# Patient Record
Sex: Female | Born: 1940
Health system: Southern US, Community
[De-identification: ages and names within clinical notes are randomized; demographics above are authoritative.]

## PROBLEM LIST (undated history)

## (undated) DIAGNOSIS — Z872 Personal history of diseases of the skin and subcutaneous tissue: Secondary | ICD-10-CM

## (undated) DIAGNOSIS — M1990 Unspecified osteoarthritis, unspecified site: Secondary | ICD-10-CM

## (undated) DIAGNOSIS — K219 Gastro-esophageal reflux disease without esophagitis: Secondary | ICD-10-CM

## (undated) DIAGNOSIS — E7800 Pure hypercholesterolemia, unspecified: Secondary | ICD-10-CM

## (undated) DIAGNOSIS — I872 Venous insufficiency (chronic) (peripheral): Secondary | ICD-10-CM

## (undated) DIAGNOSIS — K5730 Diverticulosis of large intestine without perforation or abscess without bleeding: Secondary | ICD-10-CM

## (undated) DIAGNOSIS — N200 Calculus of kidney: Secondary | ICD-10-CM

## (undated) DIAGNOSIS — F419 Anxiety disorder, unspecified: Secondary | ICD-10-CM

## (undated) DIAGNOSIS — I10 Essential (primary) hypertension: Secondary | ICD-10-CM

## (undated) DIAGNOSIS — G5600 Carpal tunnel syndrome, unspecified upper limb: Secondary | ICD-10-CM

## (undated) DIAGNOSIS — IMO0002 Reserved for concepts with insufficient information to code with codable children: Secondary | ICD-10-CM

## (undated) DIAGNOSIS — H02829 Cysts of unspecified eye, unspecified eyelid: Secondary | ICD-10-CM

## (undated) HISTORY — DX: Essential (primary) hypertension: I10

## (undated) HISTORY — DX: Venous insufficiency (chronic) (peripheral): I87.2

## (undated) HISTORY — DX: Gastro-esophageal reflux disease without esophagitis: K21.9

## (undated) HISTORY — DX: Unspecified osteoarthritis, unspecified site: M19.90

## (undated) HISTORY — DX: Calculus of kidney: N20.0

## (undated) HISTORY — DX: Personal history of diseases of the skin and subcutaneous tissue: Z87.2

## (undated) HISTORY — DX: Pure hypercholesterolemia, unspecified: E78.00

## (undated) HISTORY — DX: Diverticulosis of large intestine without perforation or abscess without bleeding: K57.30

## (undated) HISTORY — DX: Carpal tunnel syndrome, unspecified upper limb: G56.00

## (undated) HISTORY — DX: Reserved for concepts with insufficient information to code with codable children: IMO0002

## (undated) HISTORY — DX: Anxiety disorder, unspecified: F41.9

---

## 1999-06-06 ENCOUNTER — Other Ambulatory Visit: Admission: RE | Admit: 1999-06-06 | Discharge: 1999-06-06 | Payer: Self-pay | Admitting: Obstetrics and Gynecology

## 2000-06-18 ENCOUNTER — Other Ambulatory Visit: Admission: RE | Admit: 2000-06-18 | Discharge: 2000-06-18 | Payer: Self-pay | Admitting: Obstetrics and Gynecology

## 2001-06-24 ENCOUNTER — Other Ambulatory Visit: Admission: RE | Admit: 2001-06-24 | Discharge: 2001-06-24 | Payer: Self-pay | Admitting: Obstetrics and Gynecology

## 2002-02-02 HISTORY — PX: LAMINECTOMY AND MICRODISCECTOMY LUMBAR SPINE: SHX1913

## 2002-03-05 ENCOUNTER — Observation Stay (HOSPITAL_COMMUNITY): Admission: RE | Admit: 2002-03-05 | Discharge: 2002-03-06 | Payer: Self-pay | Admitting: Orthopedic Surgery

## 2002-03-05 ENCOUNTER — Encounter: Payer: Self-pay | Admitting: Orthopedic Surgery

## 2002-03-05 ENCOUNTER — Encounter (INDEPENDENT_AMBULATORY_CARE_PROVIDER_SITE_OTHER): Payer: Self-pay | Admitting: Specialist

## 2004-07-13 ENCOUNTER — Ambulatory Visit: Payer: Self-pay | Admitting: Pulmonary Disease

## 2004-08-05 ENCOUNTER — Ambulatory Visit: Payer: Self-pay | Admitting: Pulmonary Disease

## 2004-09-02 ENCOUNTER — Ambulatory Visit: Payer: Self-pay | Admitting: Pulmonary Disease

## 2005-04-04 HISTORY — PX: OTHER SURGICAL HISTORY: SHX169

## 2005-04-20 ENCOUNTER — Ambulatory Visit (HOSPITAL_COMMUNITY): Admission: RE | Admit: 2005-04-20 | Discharge: 2005-04-21 | Payer: Self-pay | Admitting: Orthopedic Surgery

## 2005-07-03 ENCOUNTER — Ambulatory Visit: Payer: Self-pay | Admitting: Pulmonary Disease

## 2005-08-15 ENCOUNTER — Ambulatory Visit: Payer: Self-pay | Admitting: Pulmonary Disease

## 2005-08-24 ENCOUNTER — Ambulatory Visit: Payer: Self-pay | Admitting: Pulmonary Disease

## 2005-12-14 ENCOUNTER — Ambulatory Visit: Payer: Self-pay | Admitting: Pulmonary Disease

## 2005-12-28 ENCOUNTER — Ambulatory Visit: Payer: Self-pay | Admitting: Pulmonary Disease

## 2006-01-25 ENCOUNTER — Ambulatory Visit: Payer: Self-pay | Admitting: Pulmonary Disease

## 2006-02-21 ENCOUNTER — Ambulatory Visit: Payer: Self-pay | Admitting: Pulmonary Disease

## 2006-04-24 ENCOUNTER — Ambulatory Visit: Payer: Self-pay | Admitting: Pulmonary Disease

## 2006-08-23 ENCOUNTER — Ambulatory Visit: Payer: Self-pay | Admitting: Pulmonary Disease

## 2006-08-31 ENCOUNTER — Ambulatory Visit: Payer: Self-pay | Admitting: Internal Medicine

## 2006-10-01 ENCOUNTER — Ambulatory Visit: Payer: Self-pay | Admitting: Pulmonary Disease

## 2006-10-01 LAB — CONVERTED CEMR LAB
OCCULT 1: POSITIVE — AB
OCCULT 2: NEGATIVE
OCCULT 4: NEGATIVE
OCCULT 5: NEGATIVE

## 2006-10-10 ENCOUNTER — Ambulatory Visit: Payer: Self-pay | Admitting: Pulmonary Disease

## 2006-10-10 LAB — CONVERTED CEMR LAB
ALT: 18 units/L (ref 0–40)
Basophils Relative: 0.8 % (ref 0.0–1.0)
Bilirubin, Direct: 0.1 mg/dL (ref 0.0–0.3)
CO2: 27 meq/L (ref 19–32)
Calcium: 9.3 mg/dL (ref 8.4–10.5)
Eosinophils Relative: 2.1 % (ref 0.0–5.0)
GFR calc Af Amer: 81 mL/min
Glucose, Bld: 103 mg/dL — ABNORMAL HIGH (ref 70–99)
Hemoglobin: 13.8 g/dL (ref 12.0–15.0)
Lymphocytes Relative: 27.1 % (ref 12.0–46.0)
Monocytes Absolute: 0.6 10*3/uL (ref 0.2–0.7)
Neutro Abs: 3.9 10*3/uL (ref 1.4–7.7)
Potassium: 3.7 meq/L (ref 3.5–5.1)
Total Bilirubin: 0.5 mg/dL (ref 0.3–1.2)
Total Protein: 6.4 g/dL (ref 6.0–8.3)
VLDL: 35 mg/dL (ref 0–40)
WBC: 6.5 10*3/uL (ref 4.5–10.5)

## 2007-05-07 ENCOUNTER — Ambulatory Visit: Payer: Self-pay | Admitting: Pulmonary Disease

## 2007-08-28 ENCOUNTER — Ambulatory Visit: Payer: Self-pay | Admitting: Pulmonary Disease

## 2007-08-28 DIAGNOSIS — I10 Essential (primary) hypertension: Secondary | ICD-10-CM

## 2007-08-28 DIAGNOSIS — G5600 Carpal tunnel syndrome, unspecified upper limb: Secondary | ICD-10-CM | POA: Insufficient documentation

## 2007-08-28 DIAGNOSIS — K219 Gastro-esophageal reflux disease without esophagitis: Secondary | ICD-10-CM | POA: Insufficient documentation

## 2007-08-28 DIAGNOSIS — E7800 Pure hypercholesterolemia, unspecified: Secondary | ICD-10-CM

## 2007-08-28 DIAGNOSIS — I872 Venous insufficiency (chronic) (peripheral): Secondary | ICD-10-CM | POA: Insufficient documentation

## 2007-08-28 DIAGNOSIS — IMO0002 Reserved for concepts with insufficient information to code with codable children: Secondary | ICD-10-CM | POA: Insufficient documentation

## 2007-08-28 DIAGNOSIS — N6009 Solitary cyst of unspecified breast: Secondary | ICD-10-CM

## 2007-08-28 DIAGNOSIS — F411 Generalized anxiety disorder: Secondary | ICD-10-CM | POA: Insufficient documentation

## 2007-09-05 HISTORY — PX: CARPAL TUNNEL RELEASE: SHX101

## 2007-10-24 LAB — CONVERTED CEMR LAB
ALT: 22 units/L (ref 0–35)
Alkaline Phosphatase: 60 units/L (ref 39–117)
BUN: 15 mg/dL (ref 6–23)
Basophils Absolute: 0 10*3/uL (ref 0.0–0.1)
Bilirubin, Direct: 0.1 mg/dL (ref 0.0–0.3)
Calcium: 9.3 mg/dL (ref 8.4–10.5)
Eosinophils Absolute: 0.2 10*3/uL (ref 0.0–0.6)
Eosinophils Relative: 1.8 % (ref 0.0–5.0)
GFR calc Af Amer: 81 mL/min
GFR calc non Af Amer: 67 mL/min
Ketones, ur: NEGATIVE mg/dL
Leukocytes, UA: NEGATIVE
MCV: 92.8 fL (ref 78.0–100.0)
Monocytes Relative: 7.9 % (ref 3.0–11.0)
Mucus, UA: NEGATIVE
Platelets: 321 10*3/uL (ref 150–400)
Potassium: 4 meq/L (ref 3.5–5.1)
RBC: 4.49 M/uL (ref 3.87–5.11)
Total Protein: 6.9 g/dL (ref 6.0–8.3)
Urine Glucose: NEGATIVE mg/dL
Urobilinogen, UA: 0.2 (ref 0.0–1.0)
WBC: 9.3 10*3/uL (ref 4.5–10.5)
pH: 6.5 (ref 5.0–8.0)

## 2008-03-23 ENCOUNTER — Ambulatory Visit (HOSPITAL_BASED_OUTPATIENT_CLINIC_OR_DEPARTMENT_OTHER): Admission: RE | Admit: 2008-03-23 | Discharge: 2008-03-23 | Payer: Self-pay | Admitting: Orthopedic Surgery

## 2008-07-31 ENCOUNTER — Telehealth (INDEPENDENT_AMBULATORY_CARE_PROVIDER_SITE_OTHER): Payer: Self-pay | Admitting: *Deleted

## 2008-08-31 ENCOUNTER — Encounter: Admission: RE | Admit: 2008-08-31 | Discharge: 2008-08-31 | Payer: Self-pay | Admitting: Obstetrics and Gynecology

## 2008-09-22 ENCOUNTER — Ambulatory Visit: Payer: Self-pay | Admitting: Family Medicine

## 2008-09-22 ENCOUNTER — Ambulatory Visit: Payer: Self-pay | Admitting: Pulmonary Disease

## 2008-10-11 LAB — CONVERTED CEMR LAB
ALT: 19 units/L (ref 0–35)
Basophils Absolute: 0.5 10*3/uL — ABNORMAL HIGH (ref 0.0–0.1)
Bilirubin Urine: NEGATIVE
CO2: 27 meq/L (ref 19–32)
Calcium: 9.7 mg/dL (ref 8.4–10.5)
Cholesterol: 188 mg/dL (ref 0–200)
Creatinine, Ser: 1 mg/dL (ref 0.4–1.2)
Crystals: NEGATIVE
GFR calc Af Amer: 71 mL/min
GFR calc non Af Amer: 59 mL/min
HCT: 41.4 % (ref 36.0–46.0)
HDL: 59 mg/dL (ref >39.0)
Hemoglobin, Urine: NEGATIVE
Hemoglobin: 14.6 g/dL (ref 12.0–15.0)
LDL Cholesterol: 107 mg/dL — ABNORMAL HIGH (ref 0–99)
MCHC: 35.3 g/dL (ref 30.0–36.0)
Monocytes Absolute: 0.4 10*3/uL (ref 0.1–1.0)
Monocytes Relative: 4.2 % (ref 3.0–12.0)
Neutro Abs: 6.3 10*3/uL (ref 1.4–7.7)
Nitrite: NEGATIVE
Platelets: 279 10*3/uL (ref 150–400)
RDW: 12.6 % (ref 11.5–14.6)
Sodium: 143 meq/L (ref 135–145)
TSH: 1.13 microintl units/mL (ref 0.35–5.50)
Total Bilirubin: 0.8 mg/dL (ref 0.3–1.2)
Total CHOL/HDL Ratio: 3.2
Triglycerides: 108 mg/dL (ref 0–149)
Urobilinogen, UA: 0.2 (ref 0.0–1.0)
Vit D, 1,25-Dihydroxy: 39 (ref 30–89)

## 2008-12-02 ENCOUNTER — Telehealth (INDEPENDENT_AMBULATORY_CARE_PROVIDER_SITE_OTHER): Payer: Self-pay | Admitting: *Deleted

## 2009-04-29 ENCOUNTER — Encounter: Admission: RE | Admit: 2009-04-29 | Discharge: 2009-04-29 | Payer: Self-pay | Admitting: Obstetrics and Gynecology

## 2009-07-09 ENCOUNTER — Encounter (INDEPENDENT_AMBULATORY_CARE_PROVIDER_SITE_OTHER): Payer: Self-pay | Admitting: *Deleted

## 2009-10-05 ENCOUNTER — Ambulatory Visit: Payer: Self-pay | Admitting: Pulmonary Disease

## 2009-10-10 LAB — CONVERTED CEMR LAB
AST: 24 units/L (ref 0–37)
Albumin: 3.8 g/dL (ref 3.5–5.2)
Alkaline Phosphatase: 53 units/L (ref 39–117)
BUN: 8 mg/dL (ref 6–23)
Basophils Absolute: 0.1 10*3/uL (ref 0.0–0.1)
CO2: 29 meq/L (ref 19–32)
Calcium: 9.6 mg/dL (ref 8.4–10.5)
Cholesterol: 168 mg/dL (ref 0–200)
Glucose, Bld: 114 mg/dL — ABNORMAL HIGH (ref 70–99)
HDL: 52.9 mg/dL (ref >39.00)
Lymphocytes Relative: 21.3 % (ref 12.0–46.0)
Monocytes Relative: 6.8 % (ref 3.0–12.0)
Platelets: 272 10*3/uL (ref 150.0–400.0)
Potassium: 4 meq/L (ref 3.5–5.1)
RDW: 12.6 % (ref 11.5–14.6)
Sodium: 141 meq/L (ref 135–145)
TSH: 0.67 microintl units/mL (ref 0.35–5.50)
Total Protein: 7 g/dL (ref 6.0–8.3)
WBC: 8.8 10*3/uL (ref 4.5–10.5)

## 2009-10-15 ENCOUNTER — Encounter (INDEPENDENT_AMBULATORY_CARE_PROVIDER_SITE_OTHER): Payer: Self-pay | Admitting: *Deleted

## 2009-10-28 ENCOUNTER — Encounter (INDEPENDENT_AMBULATORY_CARE_PROVIDER_SITE_OTHER): Payer: Self-pay | Admitting: *Deleted

## 2009-10-28 ENCOUNTER — Ambulatory Visit: Payer: Self-pay | Admitting: Gastroenterology

## 2009-11-08 ENCOUNTER — Encounter: Admission: RE | Admit: 2009-11-08 | Discharge: 2009-11-08 | Payer: Self-pay | Admitting: Obstetrics and Gynecology

## 2009-11-11 ENCOUNTER — Ambulatory Visit: Payer: Self-pay | Admitting: Gastroenterology

## 2010-04-19 ENCOUNTER — Telehealth (INDEPENDENT_AMBULATORY_CARE_PROVIDER_SITE_OTHER): Payer: Self-pay | Admitting: *Deleted

## 2010-06-07 ENCOUNTER — Ambulatory Visit: Payer: Self-pay | Admitting: Pulmonary Disease

## 2010-10-05 ENCOUNTER — Encounter: Payer: Self-pay | Admitting: Pulmonary Disease

## 2010-10-05 ENCOUNTER — Other Ambulatory Visit: Payer: Self-pay | Admitting: Pulmonary Disease

## 2010-10-05 ENCOUNTER — Ambulatory Visit: Admit: 2010-10-05 | Payer: Self-pay | Admitting: Pulmonary Disease

## 2010-10-05 ENCOUNTER — Ambulatory Visit (INDEPENDENT_AMBULATORY_CARE_PROVIDER_SITE_OTHER): Payer: PRIVATE HEALTH INSURANCE | Admitting: Pulmonary Disease

## 2010-10-05 DIAGNOSIS — I10 Essential (primary) hypertension: Secondary | ICD-10-CM

## 2010-10-05 DIAGNOSIS — E7800 Pure hypercholesterolemia, unspecified: Secondary | ICD-10-CM

## 2010-10-05 DIAGNOSIS — IMO0002 Reserved for concepts with insufficient information to code with codable children: Secondary | ICD-10-CM

## 2010-10-05 DIAGNOSIS — I872 Venous insufficiency (chronic) (peripheral): Secondary | ICD-10-CM

## 2010-10-05 DIAGNOSIS — M810 Age-related osteoporosis without current pathological fracture: Secondary | ICD-10-CM

## 2010-10-05 DIAGNOSIS — M1990 Unspecified osteoarthritis, unspecified site: Secondary | ICD-10-CM

## 2010-10-05 DIAGNOSIS — N6009 Solitary cyst of unspecified breast: Secondary | ICD-10-CM

## 2010-10-05 DIAGNOSIS — K219 Gastro-esophageal reflux disease without esophagitis: Secondary | ICD-10-CM

## 2010-10-05 DIAGNOSIS — K5730 Diverticulosis of large intestine without perforation or abscess without bleeding: Secondary | ICD-10-CM | POA: Insufficient documentation

## 2010-10-05 DIAGNOSIS — G5600 Carpal tunnel syndrome, unspecified upper limb: Secondary | ICD-10-CM

## 2010-10-05 LAB — BASIC METABOLIC PANEL
BUN: 14 mg/dL (ref 6–23)
CO2: 29 mEq/L (ref 19–32)
Chloride: 106 mEq/L (ref 96–112)
Creatinine, Ser: 1 mg/dL (ref 0.4–1.2)
Glucose, Bld: 96 mg/dL (ref 70–99)

## 2010-10-05 LAB — CBC WITH DIFFERENTIAL/PLATELET
Basophils Relative: 0.5 % (ref 0.0–3.0)
Eosinophils Absolute: 0.1 10*3/uL (ref 0.0–0.7)
Eosinophils Relative: 1.2 % (ref 0.0–5.0)
Lymphocytes Relative: 24.4 % (ref 12.0–46.0)
MCHC: 34.5 g/dL (ref 30.0–36.0)
Neutrophils Relative %: 66.4 % (ref 43.0–77.0)
RBC: 4.54 Mil/uL (ref 3.87–5.11)
WBC: 8.7 10*3/uL (ref 4.5–10.5)

## 2010-10-05 LAB — HEPATIC FUNCTION PANEL
ALT: 20 U/L (ref 0–35)
Total Protein: 6.9 g/dL (ref 6.0–8.3)

## 2010-10-05 LAB — LIPID PANEL
HDL: 46.2 mg/dL (ref >39.00)
Total CHOL/HDL Ratio: 4
Triglycerides: 120 mg/dL (ref 0.0–149.0)

## 2010-10-06 NOTE — Assessment & Plan Note (Signed)
Summary: cpx- yr ///kp   CC:  Yearly ROV & review of mult medical problems....  History of Present Illness: 70 y/o WF here for a follow up visit... she has HBP, Hypercholesterolemia, & Osteopenia- on meds below...    ~  Jan10:  she has had a good year, without any new complaints or concerns at present... she is due for a follow up BMD today & requests refills for 90 d supplies...   ~  October 05, 2009:  sh's had another good yr- no new complaints or concerns... she had Flu shot 10/10, BP controlled on meds, Chol reasonable on Simva20, continues on Fosamax, calcium, etc...    Current Problems:   Hx of SINUSITIS (ICD-473.9)                                                   ** no recent problems ** Hx of BRONCHITIS (ICD-490)  HYPERTENSION (ICD-401.9) - on ASA 81mg /d,  LISINOPRIL 20mg /d, & ZIAC 5mg /d... BP today= 132/78, takes med regularly, tolerates well... denies HA, fatigue, visual changes, CP, palipit, dizziness, syncope, dyspnea, edema, etc...   VENOUS INSUFFICIENCY (ICD-459.81)  HYPERCHOLESTEROLEMIA (ICD-272.0) - on SIMVASTATIN 20mg /d...  ~  FLP 2/08 on Lip10 showed TChol 188, TG 176, HDL 63, LDL 90  ~  FLP 1/10 on Simva20 showed TChol 188, TG 108, HDL 59, LDL 107  ~  FLP 2/11 on Simva20 showed TChol 168, TG 181, HDL 53, LDL 79  GERD (ICD-530.81) - she uses OTC Prilosec as needed... denies nausea, vomiting, heartburn, diarrhea, constipation, blood in stool, abdominal pain, swelling, gas...  Hx of BREAST CYST (ICD-610.0) - benign breast cyst asp 1989.  Hx of DEGENERATIVE DISC DISEASE (ICD-722.6) - s/p lumbar laminectomy, no signif LBP, etc...  CARPAL TUNNEL SYNDROME, BILATERAL (ICD-354.0) - she has seen DrAplington in the past; uses wrist splints qHS- had right carpel tunnel release, & right rotator cuff surg...  OSTEOPOROSIS (ICD-733.00) - on ALENDRONATE 70mg /wk, Calcium, Vits...  ~  BMD 12/07 showed TScores -0.8 to -1.9 ( sl better spine, sl worse hips)... rec- meds w/  bisphos etc  ~  labs 2/08 w/ Vit D level = 34... rec to take Vit D 1000 u OTC daily...  ~  BMD 1/10 showed TScores +0.2 in Spine, & -1.9 in right FemNeck... rec> continue Rx.  ~  labs 1/10 showed Vit D level = 39... rec> continue 1000 u OTC daily.  ANXIETY (ICD-300.00)  HEALTH MAINTANENCE = all up-to-date.  ~  COLONOSCOPY:  last done 12/00 by DrStark - negative; f/u 10 years (due now & she will call).  ~  GYN:  DrHorvath - on Estradiol + Medroxyprogesterone...  ~  MAMMOGRAM:  at [facility] yearly...  ~  BMD:  last 12/07 here w/ OSTEOPENIA, TScores -0.8 to -1.9, sl better in spine & sl worse in hips.  ~  VACCINATIONS:  FLU shot 10/10;  she refused the H1N1 vaccine; PNEUMOVAX 11/99 & 1/10 here,  TETANUS shot 11/99 & 1/10 here...   ~  NOTE:  she takes ACYCLOVIR 400Tid x5d as needed for HSV1 outbreaks...    Allergies: 1)  ! Sulfa  Comments:  Nurse/Medical Assistant: The patient's medications and allergies were reviewed with the patient and were updated in the Medication and Allergy Lists.  Past History:  Past Medical History:  Hx of SINUSITIS (ICD-473.9) Hx  of BRONCHITIS (ICD-490) HYPERTENSION (ICD-401.9) VENOUS INSUFFICIENCY (ICD-459.81) HYPERCHOLESTEROLEMIA (ICD-272.0) GERD (ICD-530.81) Hx of BREAST CYST (ICD-610.0) Hx of DEGENERATIVE DISC DISEASE (ICD-722.6) CARPAL TUNNEL SYNDROME, BILATERAL (ICD-354.0) OSTEOPOROSIS (ICD-733.00) ANXIETY (ICD-300.00)  Past Surgical History:  S/P Rt Rotator Cuff Surgery by DrAplington 8/06 S/P Lumbar Laminectomy & Microdiscectomy by DrAplington 6/03  Family History: Reviewed history and no changes required. mother deceased age 69 from heart failure father deceased age unknown from suicide 1 brother deceased age 77 from liver cancer 1 brother alive age 67 with no PMH 1 sister  alive age 57 with no PMH 1 sister alive age 1 with no PMH  Social History: Reviewed history and no changes required. widowed never smoked never alcohol  use exposed to second hand smoke walks  2-3 times week very little caffeine use 1 child  Review of Systems      See HPI  The patient denies anorexia, fever, weight loss, weight gain, vision loss, decreased hearing, hoarseness, chest pain, syncope, dyspnea on exertion, peripheral edema, prolonged cough, headaches, hemoptysis, abdominal pain, melena, hematochezia, severe indigestion/heartburn, hematuria, incontinence, muscle weakness, suspicious skin lesions, transient blindness, difficulty walking, depression, unusual weight change, abnormal bleeding, enlarged lymph nodes, and angioedema.    Vital Signs:  Patient profile:   70 year old female Height:      60 inches Weight:      147.25 pounds BMI:     28.86 O2 Sat:      98 % on Room air Temp:     97.4 degrees F oral Pulse rate:   71 / minute BP sitting:   132 / 78  (left arm) Cuff size:   regular  Vitals Entered By: Randell Loop CMA (October 05, 2009 8:58 AM)  O2 Sat at Rest %:  98 O2 Flow:  Room air CC: Yearly ROV & review of mult medical problems... Is Patient Diabetic? No Pain Assessment Patient in pain? no      Comments no changes in meds   Physical Exam  Additional Exam:  WD, WN, 70 y/o WF in NAD... GENERAL:  Alert & oriented; pleasant & cooperative... HEENT:  [place]/AT, EOM-wnl, PERRLA, Fundi-benign, EACs-clear, TMs-wnl, NOSE-clear, THROAT-clear & wnl. NECK:  Supple w/ full ROM; no JVD; normal carotid impulses w/o bruits; no thyromegaly or nodules palpated; no lymphadenopathy. CHEST:  Clear to P & A; without wheezes/ rales/ or rhonchi. HEART:  Regular Rhythm; without murmurs/ rubs/ or gallops. ABDOMEN:  Soft & nontender; normal bowel sounds; no organomegaly or masses detected.` EXT: without deformities or arthritic changes; no varicose veins/ +venous insuffic/ tr edema. NEURO:  CN's intact; motor testing normal; sensory testing normal; gait normal & balance OK. DERM:  No lesions noted; no rash etc...     MISC.  Report  Procedure date:  10/05/2009  Findings:      Lipid Panel (LIPID)   Cholesterol               168 mg/dL                   1-610   Triglycerides        [H]  181.0 mg/dL                 9.6-045.4   HDL                       09.81 mg/dL                 >19.14  LDL Cholesterol           79 mg/dL                    1-61  BMP (METABOL)   Sodium                    141 mEq/L                   135-145   Potassium                 4.0 mEq/L                   3.5-5.1   Chloride                  107 mEq/L                   96-112   Carbon Dioxide            29 mEq/L                    19-32   Glucose              [H]  114 mg/dL                   09-60   BUN                       8 mg/dL                     4-54   Creatinine                1.0 mg/dL                   0.9-8.1   Calcium                   9.6 mg/dL                   1.9-14.7   GFR                       58.72 mL/min                >60  Hepatic/Liver Function Panel (HEPATIC)   Total Bilirubin           0.3 mg/dL                   8.2-9.5   Direct Bilirubin          0.1 mg/dL                   6.2-1.3   Alkaline Phosphatase      53 U/L                      39-117   AST                       24 U/L                      0-37   ALT                       27 U/L  0-35   Total Protein             7.0 g/dL                    9.5-2.8   Albumin                   3.8 g/dL                    4.1-3.2  Comments:      CBC Platelet w/Diff (CBCD)   White Cell Count          8.8 K/uL                    4.5-10.5   Red Cell Count            4.75 Mil/uL                 3.87-5.11   Hemoglobin                14.6 g/dL                   44.0-10.2   Hematocrit                45.4 %                      36.0-46.0   MCV                       95.7 fl                     78.0-100.0   Platelet Count            272.0 K/uL                  150.0-400.0   Neutrophil %              69.6 %                      43.0-77.0   Lymphocyte %               21.3 %                      12.0-46.0   Monocyte %                6.8 %                       3.0-12.0   Eosinophils%              1.7 %                       0.0-5.0   Basophils %               0.6 %                       0.0-3.0  TSH (TSH)   FastTSH                   0.67 uIU/mL                 0.35-5.50   Impression & Recommendations:  Problem # 1:  HYPERTENSION (ICD-401.9) Controlled-  same meds... The following  medications were removed from the medication list:    Lotrel 10-20 Mg Caps (Amlodipine besy-benazepril hcl) .Marland Kitchen... Take one capsule by mouth once daily Her updated medication list for this problem includes:    Lisinopril 20 Mg Tabs (Lisinopril) .Marland Kitchen... Take 1 tablet by mouth once a day    Bisoprolol-hydrochlorothiazide 5-6.25 Mg Tabs (Bisoprolol-hydrochlorothiazide) .Marland Kitchen... Take 1 tablet by mouth once a day  Orders: TLB-Lipid Panel (80061-LIPID) TLB-BMP (Basic Metabolic Panel-BMET) (80048-METABOL) TLB-Hepatic/Liver Function Pnl (80076-HEPATIC) TLB-CBC Platelet - w/Differential (85025-CBCD) TLB-TSH (Thyroid Stimulating Hormone) (84443-TSH)  Problem # 2:  HYPERCHOLESTEROLEMIA (ICD-272.0) FLP looks good on Simva20... Her updated medication list for this problem includes:    Simvastatin 20 Mg Tabs (Simvastatin) .Marland Kitchen... 1 tab by mouth daily at bedtime  Problem # 3:  Hx of DEGENERATIVE DISC DISEASE (ICD-722.6) Stable w/o signif prob...  Problem # 4:  OSTEOPOROSIS (ICD-733.00) Continue meds... Her updated medication list for this problem includes:    Alendronate Sodium 70 Mg Tabs (Alendronate sodium) .Marland Kitchen... 1 tab by mouth once weekly  Problem # 5:  ANXIETY (ICD-300.00) Aware-  she doesn't want anxiolytic Rx.  Complete Medication List: 1)  Adult Aspirin Ec Low Strength 81 Mg Tbec (Aspirin) .... Take 1 tablet by mouth once a day 2)  Lisinopril 20 Mg Tabs (Lisinopril) .... Take 1 tablet by mouth once a day 3)  Bisoprolol-hydrochlorothiazide 5-6.25 Mg Tabs  (Bisoprolol-hydrochlorothiazide) .... Take 1 tablet by mouth once a day 4)  Simvastatin 20 Mg Tabs (Simvastatin) .Marland Kitchen.. 1 tab by mouth daily at bedtime 5)  Estradiol 0.5 Mg Tabs (Estradiol) .... Take 1 tab daily as directed by drhorvath... 6)  Medroxyprogesterone Acetate 2.5 Mg Tabs (Medroxyprogesterone acetate) .... Take 1 tab daily as directed by drhorvath... 7)  Alendronate Sodium 70 Mg Tabs (Alendronate sodium) .Marland Kitchen.. 1 tab by mouth once weekly 8)  Caltrate 600+d 600-400 Mg-unit Tabs (Calcium carbonate-vitamin d) .... Take 1 tablet by mouth once a day 9)  Womens Multivitamin Plus Tabs (Multiple vitamins-minerals) .... Take 1 tab by mouth once daily... 10)  Vitamin D 1000 Unit Tabs (Cholecalciferol) .... Take 1 tab by mouth once daily... 11)  Acyclovir 400 Mg Tabs (Acyclovir) .... Take 1 tab by mouth three times a day x 5 days as directed for fever blisters...  Other Orders: Prescription Created Electronically [phone])  Patient Instructions: 1)  Today we updated your med list- see below.... 2)  We refilled your perscriptions for 90d supplies as requested... 3)  Today we did your follow up FASTING blood work... please call the "phone tree" in a few days for your lab results.Marland KitchenMarland Kitchen 4)  Stay on your low chol/ low fat diet & increase your exercise program... 5)  Call for any questions.Marland KitchenMarland Kitchen 6)  Please schedule a follow-up appointment in 1 year, sooner as needed. Prescriptions: ALENDRONATE SODIUM 70 MG  TABS (ALENDRONATE SODIUM) 1 tab by mouth once weekly  #12 x prn   Entered and Authorized by:   Michele Mcalpine MD   Signed by:   Michele Mcalpine MD on 10/05/2009   Method used:   Print then Give to Patient   RxID:   [id number] SIMVASTATIN 20 MG  TABS (SIMVASTATIN) 1 tab by mouth daily at bedtime  #90 x prn   Entered and Authorized by:   Michele Mcalpine MD   Signed by:   Michele Mcalpine MD on 10/05/2009   Method used:   Print then Give to Patient   RxID:   [id number] BISOPROLOL-HYDROCHLOROTHIAZIDE  5-6.25 MG TABS (  BISOPROLOL-HYDROCHLOROTHIAZIDE) Take 1 tablet by mouth once a day  #90 x prn   Entered and Authorized by:   Michele Mcalpine MD   Signed by:   Michele Mcalpine MD on 10/05/2009   Method used:   Print then Give to Patient   RxID:   [id number] LISINOPRIL 20 MG TABS (LISINOPRIL) Take 1 tablet by mouth once a day  #90 x prn   Entered and Authorized by:   Michele Mcalpine MD   Signed by:   Michele Mcalpine MD on 10/05/2009   Method used:   Print then Give to Patient   RxID:   [id number] ACYCLOVIR 400 MG TABS (ACYCLOVIR) take 1 tab by mouth three times a day x 5 days as directed for fever blisters...  #30 x prn   Entered and Authorized by:   Michele Mcalpine MD   Signed by:   Michele Mcalpine MD on 10/05/2009   Method used:   Print then Give to Patient   RxID:   [id number]

## 2010-10-06 NOTE — Procedures (Signed)
Summary: Colonoscopy  Patient: Mary [name] Note: All result statuses are Final unless otherwise noted.  Tests: (1) Colonoscopy (COL)   COL Colonoscopy           DONE     [facility]     [address]     [place], [place]  [address]           COLONOSCOPY PROCEDURE REPORT           PATIENT:  Mary [name], Mary [name]  MR#:  [id number]     BIRTHDATE:  01/14/1941, 68 yrs. old  GENDER:  female           ENDOSCOPIST:  Judie Petit T. Russella Dar, MD, [facility]           PROCEDURE DATE:  11/11/2009     PROCEDURE:  Colonoscopy 19147     ASA CLASS:  Class II     INDICATIONS:  1) Routine Risk Screening           MEDICATIONS:   Fentanyl 50 mcg IV, Versed 5 mg IV           DESCRIPTION OF PROCEDURE:   After the risks benefits and     alternatives of the procedure were thoroughly explained, informed     consent was obtained.  Digital rectal exam was performed and     revealed no abnormalities.   The LB [id number] endoscope     was introduced through the anus and advanced to the cecum, which     was identified by both the appendix and ileocecal valve, without     limitations.  The quality of the prep was excellent, using     MoviPrep.  The instrument was then slowly withdrawn as the colon     was fully examined.     <<PROCEDUREIMAGES>>           FINDINGS:  Mild diverticulosis was found in the sigmoid colon. A     normal appearing cecum, ileocecal valve, and appendiceal orifice     were identified. The ascending, hepatic flexure, transverse,     splenic flexure, descending, and rectum appeared unremarkable.     Retroflexed views in the rectum revealed no abnormalities.  The     time to cecum =  1.25  minutes. The scope was then withdrawn (time     =  8.33  min) from the patient and the procedure completed.           COMPLICATIONS:  None           ENDOSCOPIC IMPRESSION:     1) Mild diverticulosis in the sigmoid colon           RECOMMENDATIONS:     1) High fiber diet with liberal fluid intake.  2) Continue current colorectal screening recommendations for     "routine risk" patients with a repeat colonoscopy in 10 years.           Venita Lick. Russella Dar, MD, [facility]           CC: Michele Mcalpine, MD           n.     Rosalie DoctorVenita Lick. Stark at 11/11/2009 09:29 AM           Mary [name], [id number]  Note: An exclamation mark (!) indicates a result that was not dispersed into the flowsheet. Document Creation Date: 11/11/2009 9:29 AM _______________________________________________________________________  (1) Order result status: Final Collection or observation date-time: 11/11/2009 09:21 Requested  date-time:  Receipt date-time:  Reported date-time:  Referring Physician:   Ordering Physician: Claudette Head [phone]) Specimen Source:  Source: Launa Grill Order Number: [id number] Lab site:   Appended Document: Colonoscopy    Clinical Lists Changes  Observations: Added new observation of COLONNXTDUE: 11/2019 (11/11/2009 11:07)

## 2010-10-06 NOTE — Progress Notes (Signed)
  Phone Note Other Incoming   Request: Send information Summary of Call: Request for records received from [place] [employer]. Request forwarded to [employer].      Appended Document:  Received an additional request  Appended Document:  Received another request  Appended Document:  Request for records received from [place] [employer].Request forwarded to [employer].

## 2010-10-06 NOTE — Letter (Signed)
Summary: Previsit letter  [facility]  [address] [place], [place] [address]   Phone: [phone]  Fax: [phone]       10/15/2009 MRN: [id number]  [facility] [place] [address] [place], [place]  [address]  Dear Ms. Hesler,  Welcome to the Gastroenterology Division at [facility].    You are scheduled to see a nurse for your pre-procedure visit on 10-28-09 at 4:30p.m. on the 3rd floor at [facility], [address]  We ask that you try to arrive at our office 15 minutes prior to your appointment time to allow for check-in.  Your nurse visit will consist of discussing your medical and surgical history, your immediate family medical history, and your medications.    Please bring a complete list of all your medications or, if you prefer, bring the medication bottles and we will list them.  We will need to be aware of both prescribed and over the counter drugs.  We will need to know exact dosage information as well.  If you are on blood thinners (Coumadin, Plavix, Aggrenox, Ticlid, etc.) please call our office today/prior to your appointment, as we need to consult with your physician about holding your medication.   Please be prepared to read and sign documents such as consent forms, a financial agreement, and acknowledgement forms.  If necessary, and with your consent, a friend or relative is welcome to sit-in on the nurse visit with you.  Please bring your insurance card so that we may make a copy of it.  If your insurance requires a referral to see a specialist, please bring your referral form from your primary care physician.  No co-pay is required for this nurse visit.     If you cannot keep your appointment, please call [phone] to cancel or reschedule prior to your appointment date.  This allows [place] the opportunity to schedule an appointment for another patient in need of care.    Thank you for choosing [facility] for your medical needs.  We  appreciate the opportunity to care for you.  Please visit [place] at our website  to learn more about our practice.                     Sincerely.                                                                                                                   The Gastroenterology Division

## 2010-10-06 NOTE — Miscellaneous (Signed)
Summary: [facility] Previsit/prep  Clinical Lists Changes  Medications: Added new medication of MOVIPREP 100 GM  SOLR (PEG-KCL-NACL-NASULF-NA ASC-C) As per prep instructions. - Signed Rx of MOVIPREP 100 GM  SOLR (PEG-KCL-NACL-NASULF-NA ASC-C) As per prep instructions.;  #1 x 0;  Signed;  Entered by: Wyona Almas RN;  Authorized by: Meryl Dare MD [facility];  Method used: Electronically to [facility]  [address] [id number] [address] [place], [place]  [address], Ph: [phone] or [phone], Fax: [phone] Observations: Added new observation of ALLERGY REV: Done (10/28/2009 16:23)    Prescriptions: MOVIPREP 100 GM  SOLR (PEG-KCL-NACL-NASULF-NA ASC-C) As per prep instructions.  #1 x 0   Entered by:   Wyona Almas RN   Authorized by:   Meryl Dare MD [facility]   Signed by:   Wyona Almas RN on 10/28/2009   Method used:   [facility] to        [facility]  [address] [id number] ([facility])       [address]       [place], [place]  [address]       Ph: [phone] or [phone]       Fax: [phone]   RxID:   [id number]

## 2010-10-06 NOTE — Assessment & Plan Note (Signed)
Summary: flu shot/ mbw  Nurse Visit   Allergies: 1)  ! Sulfa  Orders Added: 1)  Flu Vaccine 42yrs + MEDICARE PATIENTS [Q2039] 2)  Administration Flu vaccine - MCR [G0008] Flu Vaccine Consent Questions     Do you have a history of severe allergic reactions to this vaccine? no    Any prior history of allergic reactions to egg and/or gelatin? no    Do you have a sensitivity to the preservative Thimersol? no    Do you have a past history of Guillan-Barre Syndrome? no    Do you currently have an acute febrile illness? no    Have you ever had a severe reaction to latex? no    Vaccine information given and explained to patient? yes    Are you currently pregnant? no    Lot Number:[id number]   Exp Date:03/04/2011   Site Given  Left Deltoid IMmedflu   Mary [name]  June 07, 2010 10:15 AM

## 2010-10-06 NOTE — Letter (Signed)
Summary: [facility] Instructions  [facility]  [address] [place], [place] [address]   Phone: [phone]  Fax: [phone]       Mary [name]    28-Sep-1940    MRN: [id number]        Procedure Day Dorna Bloom:  Lenor Coffin  11/11/09     Arrival Time:  8:00AM     Procedure Time:  9:00AM     Location of Procedure:                    [facility] ([facility])                        PREPARATION FOR COLONOSCOPY WITH MOVIPREP   Starting 5 days prior to your procedure 11/06/09 do not eat nuts, seeds, popcorn, corn, beans, peas,  salads, or any raw vegetables.  Do not take any fiber supplements (e.g. Metamucil, Citrucel, and Benefiber).  THE DAY BEFORE YOUR PROCEDURE         DATE: 11/10/09  DAY: WEDNESDAY  1.  Drink clear liquids the entire day-NO SOLID FOOD  2.  Do not drink anything colored red or purple.  Avoid juices with pulp.  No orange juice.  3.  Drink at least 64 oz. (8 glasses) of fluid/clear liquids during the day to prevent dehydration and help the prep work efficiently.  CLEAR LIQUIDS INCLUDE: Water Jello Ice Popsicles Tea (sugar ok, no milk/cream) Powdered fruit flavored drinks Coffee (sugar ok, no milk/cream) Gatorade Juice: apple, white grape, white cranberry  Lemonade Clear bullion, consomm, broth Carbonated beverages (any kind) Strained chicken noodle soup Hard Candy                             4.  In the morning, mix first dose of MoviPrep solution:    Empty 1 Pouch A and 1 Pouch B into the disposable container    Add lukewarm drinking water to the top line of the container. Mix to dissolve    Refrigerate (mixed solution should be used within 24 hrs)  5.  Begin drinking the prep at 5:00 p.m. The MoviPrep container is divided by 4 marks.   Every 15 minutes drink the solution down to the next mark (approximately 8 oz) until the full liter is complete.   6.  Follow completed prep with 16 oz of clear liquid of your choice (Nothing  red or purple).  Continue to drink clear liquids until bedtime.  7.  Before going to bed, mix second dose of MoviPrep solution:    Empty 1 Pouch A and 1 Pouch B into the disposable container    Add lukewarm drinking water to the top line of the container. Mix to dissolve    Refrigerate  THE DAY OF YOUR PROCEDURE      DATE: 11/11/09  DAY: THURSDAY  Beginning at 4:00AM (5 hours before procedure):         1. Every 15 minutes, drink the solution down to the next mark (approx 8 oz) until the full liter is complete.  2. Follow completed prep with 16 oz. of clear liquid of your choice.    3. You may drink clear liquids until 7:00AM (2 HOURS BEFORE PROCEDURE).   MEDICATION INSTRUCTIONS  Unless otherwise instructed, you should take regular prescription medications with a small sip of water   as early as possible the morning  of your procedure.     Additional medication instructions: Hold Bisoprolol/HCTZ the morning of procedure.         OTHER INSTRUCTIONS  You will need a responsible adult at least 69 years of age to accompany you and drive you home.   This person must remain in the waiting room during your procedure.  Wear loose fitting clothing that is easily removed.  Leave jewelry and other valuables at home.  However, you may wish to bring a book to read or  an iPod/MP3 player to listen to music as you wait for your procedure to start.  Remove all body piercing jewelry and leave at home.  Total time from sign-in until discharge is approximately 2-3 hours.  You should go home directly after your procedure and rest.  You can resume normal activities the  day after your procedure.  The day of your procedure you should not:   Drive   Make legal decisions   Operate machinery   Drink alcohol   Return to work  You will receive specific instructions about eating, activities and medications before you leave.    The above instructions have been reviewed and  explained to me by  Wyona Almas RN  October 28, 2009 5:22 PM    I fully understand and can verbalize these instructions _____________________________ Date _________

## 2010-10-12 NOTE — Assessment & Plan Note (Signed)
Summary: 12 months   Vital Signs:  Patient profile:   70 year old female Height:      60 inches Weight:      141.13 pounds BMI:     27.66 O2 Sat:      99 % on room air Temp:     99.0 degrees F oral Pulse rate:   63 / minute BP sitting:   142 / 64  (left arm) Cuff size:   regular  Vitals Entered By: Randell Loop CMA (October 05, 2010 10:13 AM)  O2 Sat at Rest %:  99 O2 Flow:  room air CC: Yearly ROV & review of mult medical problems... Is Patient Diabetic? No Pain Assessment Patient in pain? no      Comments meds updated today with pt   CC:  Yearly ROV & review of mult medical problems....  History of Present Illness: 70 y/o WF here for a follow up visit... she has HBP, Hypercholesterolemia, & Osteopenia- on meds below...    ~  October 05, 2009:  sh's had another good yr- no new complaints or concerns... she had Flu shot 10/10, BP controlled on meds, Chol reasonable on Simva20, continues on Fosamax, calcium, etc...   ~  October 05, 2010:  Yearly follow up doing well w/o new complaints or concerns... she sees DrHorvath for GYN & was started on Oxybutynin for stress incontinence w/ min improvement... she's been on Alendronate for yrs 7 we discussed stopping this med for a "drug holiday" over the next yr w/ repeat BMD planned 2/13...  BP remains well controlled;  Chol looks good on med;  she had her f/u colonoscopy 3/11 by DrStark w/ divertics only, f/u 51yrs.    Current Problems:   Hx of SINUSITIS (ICD-473.9)                                                   ** no recent problems ** Hx of BRONCHITIS (ICD-490)  HYPERTENSION (ICD-401.9) - on ASA 81mg /d,  LISINOPRIL 20mg /d, & ZIAC 5mg /d... BP today= 142/64, takes med regularly, tolerates well... denies HA, fatigue, visual changes, CP, palipit, dizziness, syncope, dyspnea, edema, etc... exercises some by walking & cares for her 18mo great-grand who is blind.  VENOUS INSUFFICIENCY (ICD-459.81)  HYPERCHOLESTEROLEMIA  (ICD-272.0) - on SIMVASTATIN 20mg /d & tol well.  ~  FLP 2/08 on Lip10 showed TChol 188, TG 176, HDL 63, LDL 90  ~  FLP 1/10 on Simva20 showed TChol 188, TG 108, HDL 59, LDL 107  ~  FLP 2/11 on Simva20 showed TChol 168, TG 181, HDL 53, LDL 79  ~  FLP 2/12 on Simva20 showed TChol 164, TG 120, HDL 46, LDL 94  GERD (ICD-530.81) - she uses OTC Prilosec as needed... denies nausea, vomiting, heartburn, diarrhea, constipation, blood in stool, abdominal pain, swelling, gas...  DIVERTICULOSIS OF COLON (ICD-562.10) - she is essent asymptomatic...  ~  colonoscopy 12/00 by DrStark was neg> f/u planned 16yrs.  ~  colonoscopy 3/11 by DrStark showed mild divertics, otherw neg.  Hx of BREAST CYST (ICD-610.0) - benign breast cyst asp 1989, she gets yearly mammogram at [facility].  DEGENERATIVE JOINT DISEASE (ICD-715.90) Hx of DEGENERATIVE DISC DISEASE (ICD-722.6) - s/p lumbar laminectomy, no signif LBP now & walks some... she has DJD but not significantly limited and uses OTC meds Prn.  CARPAL TUNNEL SYNDROME, BILATERAL (ICD-354.0) - she has seen DrAplington in the past; uses wrist splints qHS- had right carpel tunnel release, & right rotator cuff surg...  OSTEOPOROSIS (ICD-733.00) - on ALENDRONATE 70mg /wk, Calcium, Vits...  ~  BMD 12/07 showed TScores -0.8 to -1.9 ( sl better spine, sl worse hips)... rec- meds w/ bisphos etc  ~  labs 2/08 w/ Vit D level = 34... rec to take Vit D 1000 u OTC daily...  ~  BMD 1/10 showed TScores +0.2 in Spine, & -1.9 in right FemNeck... rec> continue Rx.  ~  labs 1/10 showed Vit D level = 39... rec> continue 1000 u OTC daily.  ~  labs 2/12 showed Vit D level = 52... continue same.  ANXIETY (ICD-300.00)  HEALTH MAINTANENCE = all up-to-date.  ~  COLONOSCOPY:  done 3/11 by DrStark & neg x for divertics...  ~  GYN:  DrHorvath - on Estradiol + Medroxyprogesterone...  ~  M[facility]GRAM:  at [facility]...  ~  BMD:  done 1/10 here w/ OSTEOPENIA, TScores +0.2  to -1.9, sl  better in spine & sl worse in hips.  ~  VACCINATIONS:  she gets the yearly Flu vaccine; PNEUMOVAX 11/99 & 1/10 here,  TETANUS shot 11/99 & 1/10 here...   ~  NOTE:  she takes ACYCLOVIR 400Tid x5d as needed for HSV1 outbreaks...   Preventive Screening-Counseling & Management  Alcohol-Tobacco     Smoking Status: never  Allergies: 1)  ! Sulfa  Comments:  Nurse/Medical Assistant: The patient's medications and allergies were reviewed with the patient and were updated in the Medication and Allergy Lists.  Past History:  Past Medical History: Hx of SINUSITIS (ICD-473.9) Hx of BRONCHITIS (ICD-490) HYPERTENSION (ICD-401.9) VENOUS INSUFFICIENCY (ICD-459.81) HYPERCHOLESTEROLEMIA (ICD-272.0) GERD (ICD-530.81) DIVERTICULOSIS OF COLON (ICD-562.10) Hx of BREAST CYST (ICD-610.0) DEGENERATIVE JOINT DISEASE (ICD-715.90) Hx of DEGENERATIVE DISC DISEASE (ICD-722.6) CARPAL TUNNEL SYNDROME, BILATERAL (ICD-354.0) OSTEOPOROSIS (ICD-733.00) ANXIETY (ICD-300.00)  Past Surgical History: S/P Rt Rotator Cuff Surgery by DrAplington 8/06 S/P Lumbar Laminectomy & Microdiscectomy by DrAplington 6/03  Family History: Reviewed history from 10/05/2009 and no changes required. mother deceased age 33 from heart failure father deceased age unknown from suicide 1 brother deceased age 87 from liver cancer 1 brother alive age 10 with no PMH 1 sister  alive age 40 with no PMH 1 sister alive age 22 with no PMH  Social History: Reviewed history from 10/05/2009 and no changes required. widowed never smoked never alcohol use exposed to second hand smoke walks  2-3 times week very little caffeine use 1 child  Review of Systems       The patient complains of incontinence and arthritis.  The patient denies fever, chills, sweats, anorexia, fatigue, weakness, malaise, weight loss, sleep disorder, blurring, diplopia, eye irritation, eye discharge, vision loss, eye pain, photophobia, earache, ear discharge,  tinnitus, decreased hearing, nasal congestion, nosebleeds, sore throat, hoarseness, chest pain, palpitations, syncope, dyspnea on exertion, orthopnea, PND, peripheral edema, cough, dyspnea at rest, excessive sputum, hemoptysis, wheezing, pleurisy, nausea, vomiting, diarrhea, constipation, change in bowel habits, abdominal pain, melena, hematochezia, jaundice, gas/bloating, indigestion/heartburn, dysphagia, odynophagia, dysuria, hematuria, urinary frequency, urinary hesitancy, nocturia, back pain, joint pain, joint swelling, muscle cramps, muscle weakness, stiffness, sciatica, restless legs, leg pain at night, leg pain with exertion, rash, itching, dryness, suspicious lesions, paralysis, paresthesias, seizures, tremors, vertigo, transient blindness, frequent falls, frequent headaches, difficulty walking, depression, anxiety, memory loss, confusion, cold intolerance, heat intolerance, polydipsia, polyphagia, polyuria, unusual weight change, abnormal bruising, bleeding, enlarged lymph nodes,  urticaria, allergic rash, hay fever, and recurrent infections.    Physical Exam  Additional Exam:  WD, WN, 70 y/o WF in NAD... GENERAL:  Alert & oriented; pleasant & cooperative... HEENT:  [place]/AT, EOM-wnl, PERRLA, Fundi-benign, EACs-clear, TMs-wnl, NOSE-clear, THROAT-clear & wnl. NECK:  Supple w/ full ROM; no JVD; normal carotid impulses w/o bruits; no thyromegaly or nodules palpated; no lymphadenopathy. CHEST:  Clear to P & A; without wheezes/ rales/ or rhonchi. HEART:  Regular Rhythm; without murmurs/ rubs/ or gallops. ABDOMEN:  Soft & nontender; normal bowel sounds; no organomegaly or masses detected. EXT: without deformities, mild arthritic changes; no varicose veins/ venous insuffic/ or edema. NEURO:  CN's intact; motor testing normal; sensory testing normal; gait normal & balance OK. DERM:  No lesions noted; no rash etc...    Impression & Recommendations:  Problem # 1:  HYPERTENSION (ICD-401.9) Controlled>   same meds. Her updated medication list for this problem includes:    Lisinopril 20 Mg Tabs (Lisinopril) .Marland Kitchen... Take 1 tablet by mouth once a day    Bisoprolol-hydrochlorothiazide 5-6.25 Mg Tabs (Bisoprolol-hydrochlorothiazide) .Marland Kitchen... Take 1 tablet by mouth once a day  Orders: TLB-BMP (Basic Metabolic Panel-BMET) (80048-METABOL) TLB-Hepatic/Liver Function Pnl (80076-HEPATIC) TLB-CBC Platelet - w/Differential (85025-CBCD) TLB-Lipid Panel (80061-LIPID) TLB-TSH (Thyroid Stimulating Hormone) (84443-TSH) T-Vitamin D (25-Hydroxy) (16109-60454)  Problem # 2:  HYPERCHOLESTEROLEMIA (ICD-272.0) Remains stable on the Simva20... Her updated medication list for this problem includes:    Simvastatin 20 Mg Tabs (Simvastatin) .Marland Kitchen... 1 tab by mouth daily at bedtime  Problem # 3:  GI >>> Stable on PPI as needed, & up to date on colon screen.  Problem # 4:  DEGENERATIVE JOINT DISEASE (ICD-715.90) Hx LLam & known CTS (ues splints Prn)... mild DJD for which she uses OTC anti-inflamm if nec... Her updated medication list for this problem includes:    Adult Aspirin Ec Low Strength 81 Mg Tbec (Aspirin) .Marland Kitchen... Take 1 tablet by mouth once a day  Problem # 5:  OSTEOPOROSIS (ICD-733.00) We discussed her BMDs>  mother had bad osteoporosis... we decided on a drug holiday in 2012 w/ repeat BMD in 2013 & consider re-start if nec... Vit D level = 52 (continue 1000 u OTC daily). Her updated medication list for this problem includes:    Alendronate Sodium 70 Mg Tabs (Alendronate sodium) .Marland Kitchen... ** hold for drug holiday **  Problem # 6:  OTHER MEDICAL PROBLEMS AS NOTED>>>  Complete Medication List: 1)  Adult Aspirin Ec Low Strength 81 Mg Tbec (Aspirin) .... Take 1 tablet by mouth once a day 2)  Lisinopril 20 Mg Tabs (Lisinopril) .... Take 1 tablet by mouth once a day 3)  Bisoprolol-hydrochlorothiazide 5-6.25 Mg Tabs (Bisoprolol-hydrochlorothiazide) .... Take 1 tablet by mouth once a day 4)  Simvastatin 20 Mg Tabs  (Simvastatin) .Marland Kitchen.. 1 tab by mouth daily at bedtime 5)  Estradiol 0.5 Mg Tabs (Estradiol) .... Take 1 tab daily as directed by drhorvath... 6)  Medroxyprogesterone Acetate 2.5 Mg Tabs (Medroxyprogesterone acetate) .... Take 1 tab daily as directed by drhorvath... 7)  Alendronate Sodium 70 Mg Tabs (Alendronate sodium) .... ** hold for drug holiday ** 8)  Caltrate 600+d 600-400 Mg-unit Tabs (Calcium carbonate-vitamin d) .... Take 1 tablet by mouth once a day 9)  Womens Multivitamin Plus Tabs (Multiple vitamins-minerals) .... Take 1 tab by mouth once daily... 10)  Vitamin C 1000 Mg Tabs (Ascorbic acid) .... Take 1 tablet by mouth once a day 11)  Vitamin D 1000 Unit Tabs (Cholecalciferol) .... Take 1 tab by  mouth once daily... 12)  Acyclovir 400 Mg Tabs (Acyclovir) .... Take 1 tab by mouth three times a day x 5 days as directed for fever blisters... 13)  Oxybutynin Chloride 5 Mg Tabs (Oxybutynin chloride) .... Take 1 tablet by mouth once a day  Patient Instructions: 1)  Today we updated your med list- see below.... 2)  We refilled your meds for 2012... 3)  We decided to STOP the ALENDRONATE (Fosamax) for the next yr as a "drug holiday"... 4)  Today we did your follow up FASTING blood work... please call the "phone tree" in a few days for your lab results.Marland KitchenMarland Kitchen 5)  Stay as active as possible... 6)  Call for any problems.Marland KitchenMarland Kitchen 7)  Please schedule a follow-up appointment in 1 year, sooner as needed... Prescriptions: ACYCLOVIR 400 MG TABS (ACYCLOVIR) take 1 tab by mouth three times a day x 5 days as directed for fever blisters...  #30 x 4   Entered and Authorized by:   Michele Mcalpine MD   Signed by:   Michele Mcalpine MD on 10/05/2010   Method used:   Print then Give to Patient   RxID:   [id number] SIMVASTATIN 20 MG  TABS (SIMVASTATIN) 1 tab by mouth daily at bedtime  #90 x 4   Entered and Authorized by:   Michele Mcalpine MD   Signed by:   Michele Mcalpine MD on 10/05/2010   Method used:   Print then Give  to Patient   RxID:   [id number] BISOPROLOL-HYDROCHLOROTHIAZIDE 5-6.25 MG TABS (BISOPROLOL-HYDROCHLOROTHIAZIDE) Take 1 tablet by mouth once a day  #90 x 4   Entered and Authorized by:   Michele Mcalpine MD   Signed by:   Michele Mcalpine MD on 10/05/2010   Method used:   Print then Give to Patient   RxID:   [id number] LISINOPRIL 20 MG TABS (LISINOPRIL) Take 1 tablet by mouth once a day  #90 x 4   Entered and Authorized by:   Michele Mcalpine MD   Signed by:   Michele Mcalpine MD on 10/05/2010   Method used:   Print then Give to Patient   RxID:   [id number]    Orders Added: 1)  Est. Patient Level V [25427] 2)  TLB-BMP (Basic Metabolic Panel-BMET) [80048-METABOL] 3)  TLB-Hepatic/Liver Function Pnl [80076-HEPATIC] 4)  TLB-CBC Platelet - w/Differential [85025-CBCD] 5)  TLB-Lipid Panel [80061-LIPID] 6)  TLB-TSH (Thyroid Stimulating Hormone) [84443-TSH] 7)  T-Vitamin D (25-Hydroxy) [06237-62831]

## 2010-11-10 ENCOUNTER — Other Ambulatory Visit: Payer: Self-pay | Admitting: Obstetrics and Gynecology

## 2010-11-15 ENCOUNTER — Other Ambulatory Visit: Payer: Self-pay | Admitting: Obstetrics and Gynecology

## 2010-11-15 DIAGNOSIS — R928 Other abnormal and inconclusive findings on diagnostic imaging of breast: Secondary | ICD-10-CM

## 2010-11-22 ENCOUNTER — Ambulatory Visit
Admission: RE | Admit: 2010-11-22 | Discharge: 2010-11-22 | Disposition: A | Discharge status: Home / Self Care | Payer: PRIVATE HEALTH INSURANCE | Source: Ambulatory Visit | Attending: Obstetrics and Gynecology | Admitting: Obstetrics and Gynecology

## 2010-11-22 DIAGNOSIS — R928 Other abnormal and inconclusive findings on diagnostic imaging of breast: Secondary | ICD-10-CM

## 2011-01-17 NOTE — Op Note (Signed)
[name], [name]                  ACCOUNT NO.:  [id number]   MEDICAL RECORD NO.:  [id number]          PATIENT TYPE:  AMB   LOCATION:  [facility]                         FACILITY:  [facility]   PHYSICIAN:  Marlowe Kays, M.D.  DATE OF BIRTH:  10-26-1940   DATE OF PROCEDURE:  03/23/2008  DATE OF DISCHARGE:                               OPERATIVE REPORT   PREOPERATIVE DIAGNOSIS:  Carpal tunnel syndrome, right.   POSTOPERATIVE DIAGNOSIS:  Carpal tunnel syndrome, right.   OPERATION:  Decompression median nerve right wrist and hand.   SURGEON:  Marlowe Kays, M.D.   ASSISTANT:  Nurse.   ANESTHESIA:  IV regional.   PATHOLOGY AND JUSTIFICATION FOR PROCEDURE:  Pain and numbness,  consistent with carpal tunnel syndrome with nerve conduction studies  confirming the diagnosis.  Of note is that I have also performed  bilateral successful carpal tunnel surgery on her sister.  At surgery,  she had marked compression and discoloration of the median nerve in the  carpal canal.   DESCRIPTION OF PROCEDURE:  Satisfactory IV regional anesthesia, DuraPrep  to hand and forearm, which was draped in a sterile field.  A timeout  performed.  I marked out a curved incision along the base of thenar  eminence crossing obliquely over the flexor crease of the wrist in the  distal forearm.  The palmaris longus tendon was identified and retracted  to the radial side.  Beneath it, I identified the median nerve and then  began decompressing it into the distal palm.  Bleeders were coagulated  with bipolar cautery.  As noted in the mid palm, she had significant  compression.  I dissected out the neurovascular tree carefully in the  mid palm.  When the decompression was completed, I irrigated the wound  well with sterile saline and closed the wound with interrupted 4-0 nylon  mattress sutures.  Betadine adaptic dry sterile dressing and volar  plaster splint were applied.  She tolerated the procedure well and was  taken  to the recovery room in satisfactory condition with no known  complications.           ______________________________  Marlowe Kays, M.D.     JA/MEDQ  D:  03/23/2008  T:  03/23/2008  Job:  [id number]

## 2011-01-20 NOTE — Op Note (Signed)
[facility]  Patient:    Mary [name], Mary [name] Visit Number: [id number] MRN: [id number]          Service Type: OBV Location: 4W [id number] 02 Attending Physician:  Marlowe Kays Page Dictated by:   Illene Labrador. Aplington, M.D. Proc. Date: 03/05/02 Admit Date:  03/05/2002 Discharge Date: 03/06/2002                             Operative Report  PREOPERATIVE DIAGNOSES: 1. Herniated nucleus pulposus L4-5, right. 2. Synovial cyst L5-S1, right.  POSTOPERATIVE DIAGNOSES: 1. Herniated nucleus pulposus L4-5, right. 2. Synovial cyst L5-S1, right.  OPERATION: 1. Microdiskectomy L4-5 right with decompression of L4 and L5 nerve roots. 2. Microlaminectomy L5-S1 right with decompression of L5 and S1 nerve roots.  SURGEON:  Illene Labrador. Aplington, M.D.  ASSISTANT:  Georges Lynch. Darrelyn Hillock, M.D.  ANESTHESIA:  General  INDICATION FOR PROCEDURE:  She is having significant right leg pain with intermittent numbness.  An MRI demonstrated a central and to the right disc herniation at L4-5 and with pressure on the L4 and L5 nerve roots and a synovial cyst at L5-S1 with pressure on the L5 and S1 nerve roots.  See operative description below for additional details.  DESCRIPTION OF PROCEDURE:  Prophylactic antibiotics.  Satisfactory general anesthesia.  Prone position on the [place] frame.  Back was prepped with DuraPrep.  Three spinal needles on lateral x-ray tentatively localize the L4-5 and L5-S1 interspaces.  Then continued draping the back in a sterile field. Ioban employed.  Vertical incision was made based on the initial x-rays and the lamina and spinous processes which I felt were L4 and L5 were tagged with a Kocher clamp and second lateral x-ray taken confirming that we were on L4 and L5.  The L4-5 disc appeared to be just caudad to the marker on the spinous process of L4.  Then dissected the soft tissue off the lamina of L4, L5 and the sacrum and placed two self retaining  McCullough retractors.  At L4-5 I undermined the superior sacrum with a small curet and removed bone and ligamentum flavum and also removed the inferior lamina of L5 with double action rongeur and 2 and 3 mm Kerrison rongeurs.  When we had satisfactory working aperture, we then went to L4-5 where identical procedure was performed. I then brought in the microscope and working first at L4-5, we completed the decompression for the L5 nerve root and the foramen and working laterally removed all suspicion bone ligamentum flavum to provide good working window.  We noticed what appeared to be a nerve root lying in the cephalad part of the wound which we both felt most likely the L4 nerve root but lying much more caudad than would normally be the case.  The disc herniation was lying right beneath this nerve root which was also not the usual anatomic positions.  This necessitated our retracting the L4 nerve root gently with a Penfield 4 lateralward and the dura medialward so we could open the interspace starting first with the Penfield 4 and then with 15 knife blade.  First with micropituitary and then small regular pituitary, I began working in the midportion of the right disc and then into the central area and also used an upbite.  It was clear there was a good bit more disc material lateralward both from the MRI but also from observation surgery with tension under the L4 nerve root.  Dr.  Gioffre was then able to from his side use a straight pituitary and removed a number of large chunks of disc material way out lateralward and following this, the tension on the L4 nerve root was completely relieved. Following removal of all disc material laterally, I went back centrally and removed some small additional amounts.  The interspace then appeared to be clean.  With a Hockey stick, I was able to check the L4 foramen which was widely patent with the L4 nerve root being free.  The L5 nerve root was  also visible in its superior portion and the foramen looked patent as well. Potential bleeders were coagulated with bipolar cautery.  The wound was irrigated well with sterile saline and Gelfoam placed over the interspace and over the basket or bed.  Then over the L4 and L5 nerve roots and the dura.  I then redirected the scope distally to the L5-S1 interspace.  Here again, we had a variation of the normal anatomy with the L5 nerve root being visible in the superior wound and lying fairly adjacent to the S1 nerve root.  This was not a conjoined root.  There was an indentation of the S1 nerve root near its takeoff at the dural area and although we did not see a definite synovial cyst, we theorized that we had probably excised it along with bone and ligamentum flavum laterally during the decompression process.  At the conclusion of the decompression both the L5 and S1 nerve roots were visible and clearly without any impingement on them and the foramen were patent to [facility] stick.  Wound was then irrigated with sterile saline.  Gelfoam was placed over the L5 and S1 nerve roots at this interspace as well and over the dura.  Self retaining McCullough retractors were removed.  Minimal bleeders were coagulated.  The wound was dry on closure.  She was given 30 mg of Toradol IV.  The fascia was closed with interrupted #1 Vicryl.  The subcutaneous tissue was closed with 2-0 Vicryl and the skin with staples. Subcutaneous tissue was also infiltrated with 0.25% plain Marcaine.  Betadine and Adaptic dry sterile dressings were applied.  She tolerated the procedure well and was taken to the recovery room in satisfactory condition with no known complications. Dictated by:   Illene Labrador. Aplington, M.D. Attending Physician:  Joaquin Courts DD:  03/05/02 TD:  03/09/02 Job: [id number]

## 2011-01-20 NOTE — Op Note (Signed)
NAMESHANAIYA, BENE                  ACCOUNT NO.:  [id number]   MEDICAL RECORD NO.:  [id number]          PATIENT TYPE:  AMB   LOCATION:  DAY                          FACILITY:  [facility]   PHYSICIAN:  Marlowe Kays, M.D.  DATE OF BIRTH:  1941-07-25   DATE OF PROCEDURE:  04/20/2005  DATE OF DISCHARGE:                                 OPERATIVE REPORT   PREOPERATIVE DIAGNOSES:  1.  Torn rotator cuff.  2.  Degenerative arthritis AC joint right shoulder.   POSTOPERATIVE DIAGNOSES:  1.  Torn rotator cuff.  2.  Degenerative arthritis AC joint right shoulder.   OPERATION:  1.  Anterior acromionectomy, repair of torn rotator cuff.  2.  Excision distal clavicle right shoulder.   SURGEON:  Marlowe Kays, M.D.   ASSISTANT:  Mr. Idolina Primer, [place].   ANESTHESIA:  General.   PATHOLOGY AND JUSTIFICATION FOR PROCEDURE:  She has had no known injury but  has had progressive pain in her right shoulder since mid June. Plain x-rays  have demonstrated degenerative arthritis of the [facility] joint with a type 2  acromion. An MRI has demonstrated a full-thickness rotator cuff tear with  retraction. Also degenerative changes at the [facility] joint. Because of these  features, she is here today for surgical correction.   DESCRIPTION OF PROCEDURE:  Preoperative antibiotics, interscalene block by  anesthesia, general anesthesia, placed on the [place] frame in the beach-  chair position. The right shoulder girdle was prepped with DuraPrep, draped  in a sterile field, Ioban employed. I made the incision starting medial to  the [facility] joint overlying the clavicle and then curving down vertically over  the rotator cuff. The [facility] joint was identified and opened with subperiosteal  dissection. The distal 1-1/2 cm were measured out and after undermining the  clavicle at this point and protecting it with retractors, I used a microsaw  to cut the bone at this point. I then removed it with a towel clip and  cutting cautery. I checked  and there were no remaining bony spicules. The  end of the bone was covered with bone wax and then continued with dissection  lateral ward splitting the fascia over the anterior acromion and with  limited splitting of the anterior rotator cuff to expose the leading edge of  the acromion which I undermined with a small Cobb elevator. The joint fluid  came forth confirming that there was a full-thickness tear. I then performed  my initial anterior acromionectomy with a Cobb elevator underneath the  anterior acromion protecting the rotator cuff. I then made additional  subacromial bone removal with the micro saw until the subacromial space was  well decompressed. The rotator cuff tear was then identified as depicted on  the MRI and was about a centimeter in width and a centimeter and a half  forming a triangular type defect with some retraction of the deep fibers.  The biceps tendon appeared to be intact. I grasped the retracted fibers with  two small hemostats and then placed a horizontal mattress suture beginning  on the superior surface of the  retracted and nonretracted fibers coming  beneath the posterior two flaps and then out on top and then tying the  suture on the superficial rotator cuff which brought the two flaps together.  It also allowed me to have some retraction. I then roughened up  the area of  the greater tuberosity with a small rongeur and placed a Mitek double  pronged anchor and then placed the two arms up through the two sides of the  triangular defect in the rotator cuff with the arm abducted bringing the  rotator cuff back to its original position and suturing the rotator cuff at  that point. I then made multiple transverse sutures with the #1 Ethibond so  that the tear was so snugly repositioned. I then checked and made sure there  was no residual impingement, Gelfoam was placed in the defect from the  distal clavicle with resection after irrigating the wound well. I  then  closed the wound with interrupted #1 Vicryl in two layers in the deltoid  muscle and one layer in the fascia overlying the distal clavicle of the  excision site and the acromion. This gave a nice tight stable repair. The  subcutaneous tissue was closed with 2-0 Vicryl, skin with Steri-Strips, dry  sterile dressing and shoulder immobilizer applied. She tolerated the  procedure well and was taken to the recovery room in satisfactory condition  with no known complications.           ______________________________  Marlowe Kays, M.D.     JA/MEDQ  D:  04/20/2005  T:  04/20/2005  Job:  [id number]

## 2011-01-27 ENCOUNTER — Emergency Department (HOSPITAL_COMMUNITY): Payer: PRIVATE HEALTH INSURANCE

## 2011-01-27 ENCOUNTER — Emergency Department (HOSPITAL_COMMUNITY)
Admission: EM | Admit: 2011-01-27 | Discharge: 2011-01-28 | Disposition: A | Discharge status: Home / Self Care | Payer: PRIVATE HEALTH INSURANCE | Attending: Emergency Medicine | Admitting: Emergency Medicine

## 2011-01-27 DIAGNOSIS — R911 Solitary pulmonary nodule: Secondary | ICD-10-CM | POA: Insufficient documentation

## 2011-01-27 DIAGNOSIS — I10 Essential (primary) hypertension: Secondary | ICD-10-CM | POA: Insufficient documentation

## 2011-01-27 DIAGNOSIS — N201 Calculus of ureter: Secondary | ICD-10-CM | POA: Insufficient documentation

## 2011-01-27 DIAGNOSIS — K7689 Other specified diseases of liver: Secondary | ICD-10-CM | POA: Insufficient documentation

## 2011-01-27 DIAGNOSIS — R109 Unspecified abdominal pain: Secondary | ICD-10-CM | POA: Insufficient documentation

## 2011-01-27 LAB — URINE MICROSCOPIC-ADD ON

## 2011-01-27 LAB — URINALYSIS, ROUTINE W REFLEX MICROSCOPIC
Bilirubin Urine: NEGATIVE
Glucose, UA: NEGATIVE mg/dL
Ketones, ur: 15 mg/dL — AB
pH: 6 (ref 5.0–8.0)

## 2011-01-29 LAB — URINE CULTURE

## 2011-02-03 HISTORY — PX: OTHER SURGICAL HISTORY: SHX169

## 2011-02-13 ENCOUNTER — Ambulatory Visit (HOSPITAL_COMMUNITY): Payer: PRIVATE HEALTH INSURANCE | Attending: Urology

## 2011-02-13 ENCOUNTER — Ambulatory Visit (HOSPITAL_BASED_OUTPATIENT_CLINIC_OR_DEPARTMENT_OTHER)
Admission: RE | Admit: 2011-02-13 | Discharge: 2011-02-14 | Disposition: A | Discharge status: Home / Self Care | Payer: PRIVATE HEALTH INSURANCE | Source: Ambulatory Visit | Attending: Urology | Admitting: Urology

## 2011-02-13 DIAGNOSIS — N393 Stress incontinence (female) (male): Secondary | ICD-10-CM | POA: Insufficient documentation

## 2011-02-13 DIAGNOSIS — I10 Essential (primary) hypertension: Secondary | ICD-10-CM | POA: Insufficient documentation

## 2011-02-13 DIAGNOSIS — N201 Calculus of ureter: Secondary | ICD-10-CM | POA: Insufficient documentation

## 2011-02-13 LAB — POCT I-STAT, CHEM 8
BUN: 12 mg/dL (ref 6–23)
Calcium, Ion: 1.31 mmol/L (ref 1.12–1.32)
Chloride: 107 mEq/L (ref 96–112)
Creatinine, Ser: 0.9 mg/dL (ref 0.4–1.2)

## 2011-02-15 NOTE — Op Note (Signed)
NAMEIZORA, BENN                  ACCOUNT NO.:  [id number]  MEDICAL RECORD NO.:  [id number]  LOCATION:  WLED                         FACILITY:  [facility]  PHYSICIAN:  Valetta Fuller, M.D.  DATE OF BIRTH:  [birth year]  DATE OF PROCEDURE:  02/13/2011 DATE OF DISCHARGE:  01/28/2011                              OPERATIVE REPORT   PREOPERATIVE DIAGNOSES: 1. Right distal ureteral calculus. 2. Stress urinary incontinence.  POSTOPERATIVE DIAGNOSES: 1. Right distal ureteral calculus. 2. Stress urinary incontinence.  PROCEDURES PERFORMED: 1. Urethral dilation. 2. Cystoscopy. 3. Right retrograde pyelogram. 4. Right-sided ureteroscopy with holmium laser lithotripsy and     basketing of fragments. 5. Placement of right double-J stent. 6. Retropubic suburethral sling.  SURGEON:  Valetta Fuller, M.D.  ANESTHESIA:  General.  INDICATIONS:  Ms. Probert is 70 years of age.  The patient came to see [place] with some degree of bladder overactivity but mostly significant issues with stress urinary incontinence.  The patient underwent urodynamics which showed evidence of urethral hypermobility and objective stress incontinence with Valsalva leak point pressure in the 62 cm of water pressure.  She otherwise had no significant voiding dysfunction.  She underwent extensive consultation and elected to proceed with suburethral sling.  While she was waiting for that surgery to occur, she developed right-sided flank pain.  The patient was assessed and noted to have a 6 x 10 mm stone at the distal right ureter.  There was no urgent need for intervention at that time.  We suggested to her that she have a combined procedure given the size of the stone and the low likelihood of spontaneous passage.  We discussed the risks and benefits of all these procedures with her and full informed consent was obtained.  She now presents for definitive treatment of these problems.  TECHNIQUE AND FINDINGS:  The patient was  brought to the operating room where she had successful induction of general anesthesia.  She was placed in lithotomy position and prepped and draped in usual manner. Compression boots were placed for DVT prophylaxis and the patient received perioperative ciprofloxacin.  The patient had appropriate surgical time-out performed.  Her urethra was found to be snugged and therefore dilated to 28-French.  Cystoscopy revealed otherwise unremarkable bladder.  Retrograde pyelogram was done with an 8-French cone-tip catheter with fluoroscopic guidance which showed a large 10-mm filling defect in her distal right ureter approximately 4 cm above her ureterovesical junction with mild proximal dilation.  Guidewire was placed to the right renal pelvis.  The intramural ureter appeared somewhat tight on initial attempts at ureteroscopy and therefore inside portion of an access sheath was used to provide one- step dilation.  The distal ureter was then easily engaged and the stone was encountered.  A holmium laser lithotriptor fiber was inserted and holmium laser lithotripsy performed on the stone until there was approximately 8 to 10 pieces.  These were all then basket extracted.  Over the guidewire, we placed a 6-French 24-cm double-J stent with fluoroscopic as well as visual guidance and good positioning was confirmed.  Attention was then turned towards suburethral sling.  A weighted vaginal speculum was used and a Foley  catheter placed with her bladder completely drained.  A small midline incision was made over the mid urethra.  The dissection was carried out until we were able to access the retropubic space bilaterally with just the tip of a finger.  Two small stab incisions were made just lateral to midline just above the pubic symphysis.  With direct digital finger control, the lynx needle passers were placed one at a time out the mid urethra.  The Foley catheter was removed and cystoscopy was  again performed.  The needles appeared to be in good position and there was no evidence of any perforation of the bladder.  The lynx sling was then attached to the needles and brought out through suprapubic incisions.  The sheath was removed in a standard manner having a right-angle clamp underneath the sling to assure proper tensioning.  Redundant sling was cut at the level of the skin and those incisions were eventually closed with Dermabond.  The vaginal incision was copiously irrigated.  We placed a small piece of Surgicel on both sides since there was very minimal oozing.  The vaginal incision was closed with a running 2-0 Vicryl suture.  We placed some temporary vaginal packing with Estrace.  The Foley catheter had been reinserted. The patient had no obvious complications and was brought to recovery room in stable condition.     Valetta Fuller, M.D.     DSG/MEDQ  D:  02/13/2011  T:  02/13/2011  Job:  [id number]  Electronically Signed by Barron Alvine M.D. on 02/15/2011 08:41:56 AM

## 2011-02-21 ENCOUNTER — Telehealth: Payer: Self-pay | Admitting: Pulmonary Disease

## 2011-02-21 NOTE — Telephone Encounter (Signed)
Called and spoke with pt. She states that she is needing appt with SN asap for f/u on pulmonary nodule that was found on recent CT done when she was in the hospital for kidney stone. No appts available this month. Please advise, thanks!

## 2011-02-21 NOTE — Telephone Encounter (Signed)
Spoke with pt and sched appt with SN for 03/02/11 at 3 pm.

## 2011-02-21 NOTE — Telephone Encounter (Signed)
Ok to add pt on to schedule on Friday 6-22 at 9:30.  thanks

## 2011-02-21 NOTE — Telephone Encounter (Signed)
Spoke with pt and offered appt with SN for 02/24/11 and she refused, stating has to take her sister to the doctor that day. Please advise if there is a different day she can come in thanks!

## 2011-02-21 NOTE — Telephone Encounter (Signed)
Ok to use 6-28 at 3.

## 2011-03-01 ENCOUNTER — Encounter: Payer: Self-pay | Admitting: Pulmonary Disease

## 2011-03-02 ENCOUNTER — Ambulatory Visit (INDEPENDENT_AMBULATORY_CARE_PROVIDER_SITE_OTHER)
Admission: RE | Admit: 2011-03-02 | Discharge: 2011-03-02 | Disposition: A | Discharge status: Home / Self Care | Payer: PRIVATE HEALTH INSURANCE | Source: Ambulatory Visit | Attending: Pulmonary Disease | Admitting: Pulmonary Disease

## 2011-03-02 ENCOUNTER — Ambulatory Visit (INDEPENDENT_AMBULATORY_CARE_PROVIDER_SITE_OTHER): Payer: PRIVATE HEALTH INSURANCE | Admitting: Pulmonary Disease

## 2011-03-02 ENCOUNTER — Encounter: Payer: Self-pay | Admitting: Pulmonary Disease

## 2011-03-02 DIAGNOSIS — K219 Gastro-esophageal reflux disease without esophagitis: Secondary | ICD-10-CM

## 2011-03-02 DIAGNOSIS — Z87442 Personal history of urinary calculi: Secondary | ICD-10-CM | POA: Insufficient documentation

## 2011-03-02 DIAGNOSIS — M1990 Unspecified osteoarthritis, unspecified site: Secondary | ICD-10-CM

## 2011-03-02 DIAGNOSIS — I872 Venous insufficiency (chronic) (peripheral): Secondary | ICD-10-CM

## 2011-03-02 DIAGNOSIS — F411 Generalized anxiety disorder: Secondary | ICD-10-CM

## 2011-03-02 DIAGNOSIS — M810 Age-related osteoporosis without current pathological fracture: Secondary | ICD-10-CM

## 2011-03-02 DIAGNOSIS — K5730 Diverticulosis of large intestine without perforation or abscess without bleeding: Secondary | ICD-10-CM

## 2011-03-02 DIAGNOSIS — N200 Calculus of kidney: Secondary | ICD-10-CM

## 2011-03-02 DIAGNOSIS — R911 Solitary pulmonary nodule: Secondary | ICD-10-CM

## 2011-03-02 DIAGNOSIS — I10 Essential (primary) hypertension: Secondary | ICD-10-CM

## 2011-03-02 DIAGNOSIS — J984 Other disorders of lung: Secondary | ICD-10-CM

## 2011-03-02 NOTE — Patient Instructions (Signed)
Today we updated your med list in EPIC...    Continue your current meds the same...  We will arrange for a CT scan of your Chest to further evaluate the nodule seen on the CT of your Abdomen earlier this month...    We will call you w/ this report when avail...  Call for any questions...  We will determine when we will need to see you back based on the scan results.Marland KitchenMarland Kitchen

## 2011-03-02 NOTE — Progress Notes (Signed)
Subjective:    Patient ID: Mary [name], female    DOB: 12/21/1940, 70 y.o.   MRN: [id number]  HPI 70 y/o WF here for a follow up visit, she is Mary [name]'s sister... she has HBP, Hypercholesterolemia, & Osteopenia- on meds below...   ~  October 05, 2009:  sh's had another good yr- no new complaints or concerns... she had Flu shot 10/10, BP controlled on meds, Chol reasonable on Simva20, continues on Fosamax, calcium, etc...  ~  October 05, 2010:  Yearly follow up doing well w/o new complaints or concerns... she sees DrHorvath for GYN & was started on Oxybutynin for stress incontinence w/ min improvement (she has appt w/ DrGrapey to consider surg)... she's been on Alendronate for yrs 7 we discussed stopping this med for a "drug holiday" over the next yr w/ repeat BMD planned 2/13...  BP remains well controlled;  Chol looks good on med;  she had her f/u colonoscopy 3/11 by DrStark w/ divertics only, f/u 15yrs.  ~  March 02, 2011:  45mo ROV> BP remains under good control w/ Ziac & Lisinopril;  Chol continues to look good on Simva20;  Ortho stable & her Alendronate is on hold for drug holiday...    *Kidney Stone>> she developed sudden right flank pain 01/27/11 w/ N & V, went to the ER where she was diagnosed w/ right ureteral stone; treated w/ pain meds, fluids, & Keflex for infection; she was already sched for incontinence surg by DrGrapey- so on 02/13/11 he did a cysto, retrograde pyelogram, holmium laser lithotripsy w/ basketing of the fragments, double J stent, AND retropubic suburethral sling;  She reports great result- stone ablated & incont resolved...     *9mm Right base pulm nodule>> during the ER eval for the kidney stone she had a CT Abd that showed an incidental 9mm nodule right lung base (?RML); she is a never smoker, no hx lung dis or pulm problems other than the occas bout of bronchitis; last CXR was 1/10= clear, NAD; she denies cough, sputum, hemoptysis, SOB, etc; she exercises by  walking (no DOE) & cares for her blind 2y/o grandson;  We discussed further eval w/ CT Chest ==> pending.   Problem List:  Hx of SINUSITIS (ICD-473.9)                                                   << no recent problems >> Hx of BRONCHITIS (ICD-490)  PULMONARY NODULE>>  ~  6/12:  Discovered during the 5/12 ER eval for kidney stone; she had CT Abd that showed an incidental 9mm nodule right lung base (?RML); she is a never smoker, no hx lung dis or pulm problems other than the occas bout of bronchitis; last CXR was 1/10= clear, NAD; she denies cough, sputum, hemoptysis, SOB, etc; she exercises by walking (no DOE) & cares for her blind 2 y/o grandson;  We discussed further eval w/ CT Chest ==> pending.  HYPERTENSION (ICD-401.9) - on ASA 81mg /d,  LISINOPRIL 20mg /d, & ZIAC 5mg /d... BP today= 144/76, takes med regularly, tolerates well... denies HA, fatigue, visual changes, CP, palipit, dizziness, syncope, dyspnea, edema, etc... exercises some by walking & cares for her 14mo great-grand who is blind.  VENOUS INSUFFICIENCY (ICD-459.81)  HYPERCHOLESTEROLEMIA (ICD-272.0) - on SIMVASTATIN 20mg /d & tol well. ~  FLP 2/08  on Lip10 showed TChol 188, TG 176, HDL 63, LDL 90 ~  FLP 1/10 on Simva20 showed TChol 188, TG 108, HDL 59, LDL 107 ~  FLP 2/11 on Simva20 showed TChol 168, TG 181, HDL 53, LDL 79 ~  FLP 2/12 on Simva20 showed TChol 164, TG 120, HDL 46, LDL 94  GERD (ICD-530.81) - she uses OTC Prilosec as needed... denies nausea, vomiting, heartburn, diarrhea, constipation, blood in stool, abdominal pain, swelling, gas...  DIVERTICULOSIS OF COLON (ICD-562.10) - she is essent asymptomatic... ~  colonoscopy 12/00 by DrStark was neg> f/u planned 39yrs. ~  colonoscopy 3/11 by DrStark showed mild divertics, otherw neg.  Hx of BREAST CYST (ICD-610.0) - benign breast cyst asp 1989, she gets yearly mammogram at [facility].  KIDNEY STONES>>  ~  6/12:  she developed sudden right flank pain 01/27/11 w/ N &  V, went to the ER where she was diagnosed w/ right ureteral stone; treated w/ pain meds, fluids, & Keflex for infection; she was already sched for incontinence surg by DrGrapey- so on 02/13/11 he did a cysto, retrograde pyelogram, holmium laser lithotripsy w/ basketing of the fragments, double J stent, AND retropubic suburethral sling;  She reports great result- stone ablated & incont resolved...   DEGENERATIVE JOINT DISEASE (ICD-715.90) Hx of DEGENERATIVE DISC DISEASE (ICD-722.6) - s/p lumbar laminectomy, no signif LBP now & walks some... she has DJD but not significantly limited and uses OTC meds Prn.  CARPAL TUNNEL SYNDROME, BILATERAL (ICD-354.0) - she has seen DrAplington in the past; uses wrist splints qHS- had right carpel tunnel release, & right rotator cuff surg...  OSTEOPOROSIS (ICD-733.00) - on Calcium, Vits, & Vit D; prev on Alendronate x yrs & placed on hold 2/12 for drug holiday... ~  BMD 12/07 showed TScores -0.8 to -1.9 ( sl better spine, sl worse hips)... rec- meds w/ bisphos etc ~  labs 2/08 w/ Vit D level = 34... rec to take Vit D 1000 u OTC daily... ~  BMD 1/10 showed TScores +0.2 in Spine, & -1.9 in right FemNeck... rec> continue Rx. ~  labs 1/10 showed Vit D level = 39... rec> continue 1000 u OTC daily. ~  labs 2/12 showed Vit D level = 52... continue same; but we decided on a Bisphos drug holiday for the next yr.  ANXIETY (ICD-300.00)  HEALTH MAINTANENCE = all up-to-date. ~  COLONOSCOPY:  done 3/11 by DrStark & neg x for divertics... ~  GYN:  DrHorvath - on Estradiol + Medroxyprogesterone... ~  MAMMOGRAM:  at [facility] yearly... ~  BMD:  done 1/10 here w/ OSTEOPENIA, TScores +0.2  to -1.9, sl better in spine & sl worse in hips. ~  VACCINATIONS:  she gets the yearly Flu vaccine; PNEUMOVAX 11/99 & 1/10 here,  TETANUS shot 11/99 & 1/10 here...  ~  NOTE:  she takes ACYCLOVIR 400Tid x5d as needed for HSV1 outbreaks...   Past Surgical History  Procedure Date  . Right  rotator cuff surgery 04/2005    Dr. Leslee Home  . Laminectomy and microdiscectomy lumbar spine 02/2002    Dr. Leslee Home  . Holmium laser lithotripsy for right ureteral stone 6/12    by DrGrapey  . Retropubic suburethral sling for urinary incontinence 6/12     by DrGrapey    Outpatient Encounter Prescriptions as of 03/02/2011  Medication Sig Dispense Refill  . acyclovir (ZOVIRAX) 400 MG tablet Take 400 mg by mouth 3 (three) times daily. X 5 days as directed for fever blisters       .  aspirin 81 MG tablet Take 81 mg by mouth daily.        . bisoprolol-hydrochlorothiazide (ZIAC) 5-6.25 MG per tablet Take 1 tablet by mouth daily.        . Calcium Carbonate-Vit D-Min (CALTRATE 600+D PLUS) 600-400 MG-UNIT per tablet Take 1 tablet by mouth daily.        . cholecalciferol (VITAMIN D) 1000 UNITS tablet Take 1,000 Units by mouth daily.        Marland Kitchen estradiol (ESTRACE) 0.5 MG tablet Take 0.5 mg by mouth daily. As directed by Dr. Henderson Cloud       . lisinopril (PRINIVIL,ZESTRIL) 20 MG tablet Take 20 mg by mouth daily.        . medroxyPROGESTERone (PROVERA) 2.5 MG tablet Take 2.5 mg by mouth daily. As directed by Dr. Henderson Cloud       . Multiple Vitamins-Minerals (WOMENS MULTIVITAMIN PLUS) TABS Take 1 tablet by mouth daily.        . simvastatin (ZOCOR) 20 MG tablet Take 20 mg by mouth at bedtime.        . vitamin C (ASCORBIC ACID) 500 MG tablet Take 500 mg by mouth daily.        Marland Kitchen DISCONTD: alendronate (FOSAMAX) 70 MG tablet Take 70 mg by mouth every 7 (seven) days. Take with a full glass of water on an empty stomach.   ==> Bisphos drug holiday since 2/12...    . DISCONTD: oxybutynin (DITROPAN) 5 MG tablet Take 5 mg by mouth daily.          Allergies  Allergen Reactions  . Sulfonamide Derivatives     REACTION: nausea    Review of Systems       The patient denies fever, chills, sweats, anorexia, fatigue, weakness, malaise, weight loss, sleep disorder, blurring, diplopia, eye irritation, eye discharge, vision  loss, eye pain, photophobia, earache, ear discharge, tinnitus, decreased hearing, nasal congestion, nosebleeds, sore throat, hoarseness, chest pain, palpitations, syncope, dyspnea on exertion, orthopnea, PND, peripheral edema, cough, dyspnea at rest, excessive sputum, hemoptysis, wheezing, pleurisy, nausea, vomiting, diarrhea, constipation, change in bowel habits, abdominal pain, melena, hematochezia, jaundice, gas/bloating, indigestion/heartburn, dysphagia, odynophagia, dysuria, hematuria, urinary frequency, urinary hesitancy, nocturia, back pain, joint pain, joint swelling, muscle cramps, muscle weakness, stiffness, sciatica, restless legs, leg pain at night, leg pain with exertion, rash, itching, dryness, suspicious lesions, paralysis, paresthesias, seizures, tremors, vertigo, transient blindness, frequent falls, frequent headaches, difficulty walking, depression, anxiety, memory loss, confusion, cold intolerance, heat intolerance, polydipsia, polyphagia, polyuria, unusual weight change, abnormal bruising, bleeding, enlarged lymph nodes, urticaria, allergic rash, hay fever, and recurrent infections.     Objective:   Physical Exam     WD, WN, 70 y/o WF in NAD... GENERAL:  Alert & oriented; pleasant & cooperative... HEENT:  [place]/AT, EOM-wnl, PERRLA, Fundi-benign, EACs-clear, TMs-wnl, NOSE-clear, THROAT-clear & wnl. NECK:  Supple w/ full ROM; no JVD; normal carotid impulses w/o bruits; no thyromegaly or nodules palpated; no lymphadenopathy. CHEST:  Clear to P & A; without wheezes/ rales/ or rhonchi. HEART:  Regular Rhythm; without murmurs/ rubs/ or gallops. ABDOMEN:  Soft & nontender; normal bowel sounds; no organomegaly or masses detected. EXT: without deformities, mild arthritic changes; no varicose veins/ venous insuffic/ or edema. NEURO:  CN's intact; motor testing normal; sensory testing normal; gait normal & balance OK. DERM:  No lesions noted; no rash etc...   Assessment & Plan:   PULM  NODULE>  Incidentally found during CT Abd for kidney stone;  as noted last CXR 1/10 was  neg & she is a never smoker;  We decided to check f/u CXR & CT Chest for further eval...  Hx Sinusitis & Bronchitis>  No recent problems...  HBP>  Controlled on meds...  CHOL>  Stable on the Simva20...  GI>  GERD, Divertics>  Stable & up to date...  DJD, Osteopenia>  Stable & doing well; plan f/u BMD after 1/13 for recheck...  Anxiety>  She is handling the stress well, does not request anxiolytics.Marland KitchenMarland Kitchen

## 2011-03-06 ENCOUNTER — Telehealth: Payer: Self-pay | Admitting: Pulmonary Disease

## 2011-03-06 NOTE — Telephone Encounter (Signed)
Noted. Documented pre cert # in order.

## 2011-03-07 ENCOUNTER — Ambulatory Visit (INDEPENDENT_AMBULATORY_CARE_PROVIDER_SITE_OTHER)
Admission: RE | Admit: 2011-03-07 | Discharge: 2011-03-07 | Disposition: A | Discharge status: Home / Self Care | Payer: PRIVATE HEALTH INSURANCE | Source: Ambulatory Visit | Attending: Pulmonary Disease | Admitting: Pulmonary Disease

## 2011-03-07 DIAGNOSIS — J984 Other disorders of lung: Secondary | ICD-10-CM

## 2011-03-07 DIAGNOSIS — R911 Solitary pulmonary nodule: Secondary | ICD-10-CM

## 2011-03-07 MED ORDER — IOHEXOL 300 MG/ML  SOLN
80.0000 mL | Freq: Once | INTRAMUSCULAR | Status: AC | PRN
  Administered 2011-03-07: 80 mL via INTRAVENOUS

## 2011-05-15 ENCOUNTER — Telehealth: Payer: Self-pay | Admitting: Pulmonary Disease

## 2011-05-15 MED ORDER — AZITHROMYCIN 250 MG PO TABS: ORAL_TABLET | ORAL | Status: AC

## 2011-05-15 MED ORDER — PREDNISONE (PAK) 5 MG PO TABS: ORAL_TABLET | ORAL | Status: DC

## 2011-05-15 NOTE — Telephone Encounter (Signed)
Per SN----ok for zpak  #1  Take as directed and pred dosepak  #1   5mg     6 day pack  To take as directed.  thanks

## 2011-05-15 NOTE — Telephone Encounter (Signed)
Pt c/o runny nose eyes, dry cough, sore throat, sinus pressure, PND starting Friday night, was in bed all day Saturday and Sunday. T-99.3 yesterday. Denies nausea, vomiting, diarrhea, chills, sweats, body aches. Pt states has been taking Zyrtec w/o any relief and prn APAP. Has used z-pak in the past and same worked. Please advise. Thanks.  [facility] [address]  Allergies  Allergen Reactions  . Sulfonamide Derivatives     REACTION: nausea

## 2011-05-15 NOTE — Telephone Encounter (Signed)
I spoke with pt and she is aware of SN recs. I called [facility] [address] and gave them verbal order for zpak #1 and pred dose pak #1 5 mg 6 day pack. Nothing further was needed

## 2011-06-02 LAB — POCT I-STAT 4, (NA,K, GLUC, HGB,HCT)
Glucose, Bld: 93
HCT: 42
Hemoglobin: 14.3
Operator id: 268271
Sodium: 140

## 2011-06-30 ENCOUNTER — Ambulatory Visit (INDEPENDENT_AMBULATORY_CARE_PROVIDER_SITE_OTHER): Payer: PRIVATE HEALTH INSURANCE

## 2011-06-30 DIAGNOSIS — Z23 Encounter for immunization: Secondary | ICD-10-CM

## 2011-10-06 ENCOUNTER — Ambulatory Visit (INDEPENDENT_AMBULATORY_CARE_PROVIDER_SITE_OTHER)
Admission: RE | Admit: 2011-10-06 | Discharge: 2011-10-06 | Disposition: A | Discharge status: Home / Self Care | Payer: PRIVATE HEALTH INSURANCE | Source: Ambulatory Visit | Attending: Pulmonary Disease | Admitting: Pulmonary Disease

## 2011-10-06 ENCOUNTER — Ambulatory Visit (INDEPENDENT_AMBULATORY_CARE_PROVIDER_SITE_OTHER): Payer: PRIVATE HEALTH INSURANCE | Admitting: Pulmonary Disease

## 2011-10-06 ENCOUNTER — Encounter: Payer: Self-pay | Admitting: Pulmonary Disease

## 2011-10-06 ENCOUNTER — Other Ambulatory Visit (INDEPENDENT_AMBULATORY_CARE_PROVIDER_SITE_OTHER): Payer: PRIVATE HEALTH INSURANCE

## 2011-10-06 DIAGNOSIS — I10 Essential (primary) hypertension: Secondary | ICD-10-CM

## 2011-10-06 DIAGNOSIS — N200 Calculus of kidney: Secondary | ICD-10-CM

## 2011-10-06 DIAGNOSIS — F411 Generalized anxiety disorder: Secondary | ICD-10-CM

## 2011-10-06 DIAGNOSIS — E7800 Pure hypercholesterolemia, unspecified: Secondary | ICD-10-CM

## 2011-10-06 DIAGNOSIS — M1990 Unspecified osteoarthritis, unspecified site: Secondary | ICD-10-CM

## 2011-10-06 DIAGNOSIS — M810 Age-related osteoporosis without current pathological fracture: Secondary | ICD-10-CM

## 2011-10-06 DIAGNOSIS — F419 Anxiety disorder, unspecified: Secondary | ICD-10-CM

## 2011-10-06 DIAGNOSIS — R911 Solitary pulmonary nodule: Secondary | ICD-10-CM

## 2011-10-06 DIAGNOSIS — I872 Venous insufficiency (chronic) (peripheral): Secondary | ICD-10-CM

## 2011-10-06 LAB — BASIC METABOLIC PANEL
BUN: 13 mg/dL (ref 6–23)
Creatinine, Ser: 0.9 mg/dL (ref 0.4–1.2)
GFR: 63.48 mL/min (ref >60.00)

## 2011-10-06 LAB — TSH: TSH: 0.43 u[IU]/mL (ref 0.35–5.50)

## 2011-10-06 LAB — HEPATIC FUNCTION PANEL
ALT: 25 U/L (ref 0–35)
Total Bilirubin: 0.7 mg/dL (ref 0.3–1.2)

## 2011-10-06 LAB — LIPID PANEL
Cholesterol: 191 mg/dL (ref 0–200)
HDL: 52.3 mg/dL (ref >39.00)
LDL Cholesterol: 103 mg/dL — ABNORMAL HIGH (ref 0–99)
Triglycerides: 177 mg/dL — ABNORMAL HIGH (ref 0.0–149.0)
VLDL: 35.4 mg/dL (ref 0.0–40.0)

## 2011-10-06 NOTE — Patient Instructions (Signed)
Today we updated your med list in our EPIC system...    Continue your current medications the same...  Today we did your follow up CXR & fasting blood work...    Please call the [facility]REE in a few days for your results...    Dial [id number] & when prompted enter your patient number followed by the # symbol...    Your patient number is:  [id number]  Monitor your BP at home 7 let me know if it runs >150/90...  Call for any questions...  Let's plan a follow up viist in another 6 months.Marland KitchenMarland Kitchen

## 2011-10-06 NOTE — Progress Notes (Signed)
Subjective:    Patient ID: Mary [name], female    DOB: [birth year], 71 y.o.   MRN: [id number]  HPI 71 y/o WF here for a follow up visit, she is Mary [name]'s sister... she has HBP, Hypercholesterolemia, & Osteopenia- on meds below...   ~  October 05, 2009:  sh's had another good yr- no new complaints or concerns... she had Flu shot 10/10, BP controlled on meds, Chol reasonable on Simva20, continues on Fosamax, calcium, etc...  ~  October 05, 2010:  Yearly follow up doing well w/o new complaints or concerns... she sees DrHorvath for GYN & was started on Oxybutynin for stress incontinence w/ min improvement (she has appt w/ DrGrapey to consider surg)... she's been on Alendronate for yrs 7 we discussed stopping this med for a "drug holiday" over the next yr w/ repeat BMD planned 2/13...  BP remains well controlled;  Chol looks good on med;  she had her f/u colonoscopy 3/11 by DrStark w/ divertics only, f/u 24yrs.  ~  March 02, 2011:  40mo ROV> BP remains under good control w/ Ziac & Lisinopril;  Chol continues to look good on Simva20;  Ortho stable & her Alendronate is on hold for drug holiday...    <Kidney Stone>  she developed sudden right flank pain 01/27/11 w/ N & V, went to the ER where she was diagnosed w/ right ureteral stone; treated w/ pain meds, fluids, & Keflex for infection; she was already sched for incontinence surg by DrGrapey- so on 02/13/11 he did a cysto, retrograde pyelogram, holmium laser lithotripsy w/ basketing of the fragments, double J stent, AND retropubic suburethral sling;  She reports great result- stone ablated & incont resolved...     <25mm Right base pulm nodule>  during the ER eval for the kidney stone she had a CT Abd that showed an incidental 9mm nodule right lung base (?RML); she is a never smoker, no hx lung dis or pulm problems other than the occas bout of bronchitis; last CXR was 1/10= clear, NAD; she denies cough, sputum, hemoptysis, SOB, etc; she exercises by  walking (no DOE) & cares for her blind 2y/o grandson;  We discussed further eval w/ CT Chest ==> confirms 8mm RML nodule, 4mm LLL nodule, no adenopathy, heterogeneous thyroid, DJD spine...  We discussed options and decided on watchful waiting w/ CXR in 85mo & repeat CT Chest in 6-21mo...  ~  October 06, 2011:  85mo ROV & she reports a good interval but had recent bronchitic exac & [facility] (given 2 [facility] & cough syrup- didn't bring bottles); now improved> f/u CXR today shows clear lungs, the 8mm RML nodule seen on CXR cannot be seen on the current film & we discussed f/u in 85mo w/ another CT at that time...  She is due for Fasting blood work today> see below; she had 2012 Flu vaccine 9/12, & requests refill prescriptions for 90d supplies...          Problem List:  Hx of SINUSITIS (ICD-473.9)                                                  Hx of BRONCHITIS (ICD-490) >> ~  1/13:  She reports recent bronchitic exac & went [facility]> treated w/ 2 antibiotics ?which ones, and cough syrup; symptoms  resolved & back to baseline...  PULMONARY NODULE>>  ~  6/12:  Discovered during the 5/12 ER eval for kidney stone; she had CT Abd that showed an incidental 9mm nodule right lung base (?RML); she is a never smoker, no hx lung dis or pulm problems other than the occas bout of bronchitis; last CXR was 1/10= clear, NAD; she denies cough, sputum, hemoptysis, SOB, etc; she exercises by walking (no DOE) & cares for her blind 2 y/o grandson;  We discussed further eval w/ CT Chest. ~  7/12:  CT Chest w/ contrast confirms 8mm RML nodule, 4mm LLL nodule, no adenopathy, heterogeneous thyroid, DJD spine... ~  2/13:  CXR is clear, no infiltrates, nodules, adenopathy, etc...  HYPERTENSION (ICD-401.9) - on ASA 81mg /d,  LISINOPRIL 20mg /d, & ZIAC 5mg /d...  ~  6/12: BP today= 144/76, takes med regularly, tolerates well> denies HA, fatigue, visual changes, CP, palpit, dizziness, syncope, dyspnea, edema,  etc...  VENOUS INSUFFICIENCY (ICD-459.81) >> she knows to elim sodium, elev legs, & wear support hose...  HYPERCHOLESTEROLEMIA (ICD-272.0) - on SIMVASTATIN 20mg /d & tol well. ~  FLP 2/08 on Lip10 showed TChol 188, TG 176, HDL 63, LDL 90 ~  FLP 1/10 on Simva20 showed TChol 188, TG 108, HDL 59, LDL 107 ~  FLP 2/11 on Simva20 showed TChol 168, TG 181, HDL 53, LDL 79 ~  FLP 2/12 on Simva20 showed TChol 164, TG 120, HDL 46, LDL 94 ~  FLP 2/13 on Simva20 showed TChol 191, TG 177, HDL 52, LDL 103  GERD (ICD-530.81) - she uses OTC Prilosec as needed... denies nausea, vomiting, heartburn, diarrhea, constipation, blood in stool, abdominal pain, swelling, gas...  DIVERTICULOSIS OF COLON (ICD-562.10) - she is essent asymptomatic... ~  colonoscopy 12/00 by DrStark was neg> f/u planned 52yrs. ~  colonoscopy 3/11 by DrStark showed mild divertics, otherw neg.  Hx of BREAST CYST (ICD-610.0) - benign breast cyst asp 1989, she gets yearly mammogram at [facility].  KIDNEY STONES>>  ~  6/12:  she developed sudden right flank pain 01/27/11 w/ N & V, went to the ER where she was diagnosed w/ right ureteral stone; treated w/ pain meds, fluids, & Keflex for infection; she was already sched for incontinence surg by DrGrapey- so on 02/13/11 he did a cysto, retrograde pyelogram, holmium laser lithotripsy w/ basketing of the fragments, double J stent, AND retropubic suburethral sling;  She reports great result- stone ablated & incont resolved...   DEGENERATIVE JOINT DISEASE (ICD-715.90) Hx of DEGENERATIVE DISC DISEASE (ICD-722.6) - s/p lumbar laminectomy, no signif LBP now & walks some... she has DJD but not significantly limited and uses OTC meds Prn.  CARPAL TUNNEL SYNDROME, BILATERAL (ICD-354.0) - she has seen DrAplington in the past; uses wrist splints qHS- had right carpel tunnel release, & right rotator cuff surg...  OSTEOPOROSIS (ICD-733.00) - on Calcium, Vits, & Vit D; prev on Alendronate x yrs & placed on hold  2/12 for drug holiday... ~  BMD 12/07 showed TScores -0.8 to -1.9 ( sl better spine, sl worse hips)... rec- meds w/ bisphos etc ~  labs 2/08 w/ Vit D level = 34... rec to take Vit D 1000 u OTC daily... ~  BMD 1/10 showed TScores +0.2 in Spine, & -1.9 in right FemNeck... rec> continue Rx. ~  labs 1/10 showed Vit D level = 39... rec> continue 1000 u OTC daily. ~  labs 2/12 showed Vit D level = 52... continue same; but we decided on a Bisphos drug holiday for the  next yr. ~  Labs  2/13 showed Vit D level = 59... Continue same.  ANXIETY (ICD-300.00)  HEALTH MAINTANENCE = all up-to-date. ~  COLONOSCOPY:  done 3/11 by DrStark & neg x for divertics... ~  GYN:  DrHorvath - on Estradiol + Medroxyprogesterone... ~  MAMMOGRAM:  at [facility] yearly... ~  BMD:  done 1/10 here w/ OSTEOPENIA, TScores +0.2  to -1.9, sl better in spine & sl worse in hips. ~  VACCINATIONS:  she gets the yearly Flu vaccine; PNEUMOVAX 11/99 & 1/10 here,  TETANUS shot 11/99 & 1/10 here...  ~  NOTE:  she takes ACYCLOVIR 400Tid x5d as needed for HSV1 outbreaks...   Past Surgical History  Procedure Date  . Right rotator cuff surgery 04/2005    Dr. Leslee Home  . Laminectomy and microdiscectomy lumbar spine 02/2002    Dr. Leslee Home  . Holmium laser lithotripsy for right ureteral stone 6/12    by DrGrapey  . Retropubic suburethral sling for urinary incontinence 6/12     by DrGrapey    Outpatient Encounter Prescriptions as of 10/06/2011  Medication Sig Dispense Refill  . acyclovir (ZOVIRAX) 400 MG tablet Take 400 mg by mouth 3 (three) times daily. X 5 days as directed for fever blisters       . aspirin 81 MG tablet Take 81 mg by mouth daily.        . bisoprolol-hydrochlorothiazide (ZIAC) 5-6.25 MG per tablet Take 1 tablet by mouth daily.        . Calcium Carbonate-Vit D-Min (CALTRATE 600+D PLUS) 600-400 MG-UNIT per tablet Take 1 tablet by mouth daily.        . cholecalciferol (VITAMIN D) 1000 UNITS tablet Take 1,000 Units by  mouth daily.        Marland Kitchen estradiol (ESTRACE) 0.5 MG tablet Take 0.5 mg by mouth daily. As directed by Dr. Henderson Cloud       . lisinopril (PRINIVIL,ZESTRIL) 20 MG tablet Take 20 mg by mouth daily.        . medroxyPROGESTERone (PROVERA) 2.5 MG tablet Take 2.5 mg by mouth daily. As directed by Dr. Henderson Cloud       . Multiple Vitamins-Minerals (WOMENS MULTIVITAMIN PLUS) TABS Take 1 tablet by mouth daily.        . simvastatin (ZOCOR) 20 MG tablet Take 20 mg by mouth at bedtime.        . vitamin C (ASCORBIC ACID) 500 MG tablet Take 500 mg by mouth daily.        Marland Kitchen DISCONTD: predniSONE (STERAPRED UNI-PAK) 5 MG TABS 6 day pack  21 tablet  0    Allergies  Allergen Reactions  . Sulfonamide Derivatives     REACTION: nausea    Review of Systems       The patient denies fever, chills, sweats, anorexia, fatigue, weakness, malaise, weight loss, sleep disorder, blurring, diplopia, eye irritation, eye discharge, vision loss, eye pain, photophobia, earache, ear discharge, tinnitus, decreased hearing, nasal congestion, nosebleeds, sore throat, hoarseness, chest pain, palpitations, syncope, dyspnea on exertion, orthopnea, PND, peripheral edema, cough, dyspnea at rest, excessive sputum, hemoptysis, wheezing, pleurisy, nausea, vomiting, diarrhea, constipation, change in bowel habits, abdominal pain, melena, hematochezia, jaundice, gas/bloating, indigestion/heartburn, dysphagia, odynophagia, dysuria, hematuria, urinary frequency, urinary hesitancy, nocturia, back pain, joint pain, joint swelling, muscle cramps, muscle weakness, stiffness, sciatica, restless legs, leg pain at night, leg pain with exertion, rash, itching, dryness, suspicious lesions, paralysis, paresthesias, seizures, tremors, vertigo, transient blindness, frequent falls, frequent headaches, difficulty walking, depression, anxiety, memory loss,  confusion, cold intolerance, heat intolerance, polydipsia, polyphagia, polyuria, unusual weight change, abnormal bruising,  bleeding, enlarged lymph nodes, urticaria, allergic rash, hay fever, and recurrent infections.     Objective:   Physical Exam     WD, WN, 71 y/o WF in NAD... GENERAL:  Alert & oriented; pleasant & cooperative... HEENT:  [place]/AT, EOM-wnl, PERRLA, Fundi-benign, EACs-clear, TMs-wnl, NOSE-clear, THROAT-clear & wnl. NECK:  Supple w/ full ROM; no JVD; normal carotid impulses w/o bruits; no thyromegaly or nodules palpated; no lymphadenopathy. CHEST:  Clear to P & A; without wheezes/ rales/ or rhonchi. HEART:  Regular Rhythm; without murmurs/ rubs/ or gallops. ABDOMEN:  Soft & nontender; normal bowel sounds; no organomegaly or masses detected. EXT: without deformities, mild arthritic changes; no varicose veins/ venous insuffic/ or edema. NEURO:  CN's intact; motor testing normal; sensory testing normal; gait normal & balance OK. DERM:  No lesions noted; no rash etc...  RADIOLOGY DATA:  Reviewed in the EPIC EMR & discussed w/ the patient...  LABORATORY DATA:  Reviewed in the EPIC EMR & discussed w/ the patient...   Assessment & Plan:   PULM NODULE>   8mm RML nodule seen on CT scan, not visible on CXRs to date; she is low risk (never smoker etc) & we will plan f/u CT Chest in 6 months...  Hx Sinusitis & Bronchitis>  Recent [facility]ac [facility] w/ ?antibiotics & cough syrup- resolved...  HBP>  Controlled on meds...  CHOL>  Stable on the Simva20; we reviewed diet...  GI>  GERD, Divertics>  Stable & up to date...  DJD, Osteopenia>  Stable & doing well; plan f/u BMD after 1/13 for recheck...  Anxiety>  She is handling the stress well, does not request anxiolytics...   Patient's Medications  New Prescriptions   No medications on file  Previous Medications   ACYCLOVIR (ZOVIRAX) 400 MG TABLET    Take 400 mg by mouth 3 (three) times daily. X 5 days as directed for fever blisters    ASPIRIN 81 MG TABLET    Take 81 mg by mouth daily.     BISOPROLOL-HYDROCHLOROTHIAZIDE (ZIAC)  5-6.25 MG PER TABLET    Take 1 tablet by mouth daily.     CALCIUM CARBONATE-VIT D-MIN (CALTRATE 600+D PLUS) 600-400 MG-UNIT PER TABLET    Take 1 tablet by mouth daily.     CHOLECALCIFEROL (VITAMIN D) 1000 UNITS TABLET    Take 1,000 Units by mouth daily.     ESTRADIOL (ESTRACE) 0.5 MG TABLET    Take 0.5 mg by mouth daily. As directed by Dr. Henderson Cloud    LISINOPRIL (PRINIVIL,ZESTRIL) 20 MG TABLET    Take 20 mg by mouth daily.     MEDROXYPROGESTERONE (PROVERA) 2.5 MG TABLET    Take 2.5 mg by mouth daily. As directed by Dr. Henderson Cloud    MULTIPLE VITAMINS-MINERALS (WOMENS MULTIVITAMIN PLUS) TABS    Take 1 tablet by mouth daily.     SIMVASTATIN (ZOCOR) 20 MG TABLET    Take 20 mg by mouth at bedtime.     VITAMIN C (ASCORBIC ACID) 500 MG TABLET    Take 500 mg by mouth daily.    Modified Medications   No medications on file  Discontinued Medications   PREDNISONE (STERAPRED UNI-PAK) 5 MG TABS    6 day pack

## 2011-10-07 ENCOUNTER — Encounter: Payer: Self-pay | Admitting: Pulmonary Disease

## 2011-10-07 LAB — VITAMIN D 25 HYDROXY (VIT D DEFICIENCY, FRACTURES): Vit D, 25-Hydroxy: 59 ng/mL (ref 30–89)

## 2011-10-28 ENCOUNTER — Other Ambulatory Visit: Payer: Self-pay | Admitting: Pulmonary Disease

## 2011-11-04 ENCOUNTER — Other Ambulatory Visit: Payer: Self-pay | Admitting: Pulmonary Disease

## 2011-12-07 ENCOUNTER — Other Ambulatory Visit: Payer: Self-pay | Admitting: Pulmonary Disease

## 2012-03-08 ENCOUNTER — Other Ambulatory Visit: Payer: Self-pay | Admitting: Pulmonary Disease

## 2012-04-03 ENCOUNTER — Ambulatory Visit (INDEPENDENT_AMBULATORY_CARE_PROVIDER_SITE_OTHER): Payer: PRIVATE HEALTH INSURANCE | Admitting: Pulmonary Disease

## 2012-04-03 ENCOUNTER — Encounter: Payer: Self-pay | Admitting: Pulmonary Disease

## 2012-04-03 ENCOUNTER — Other Ambulatory Visit (INDEPENDENT_AMBULATORY_CARE_PROVIDER_SITE_OTHER): Payer: PRIVATE HEALTH INSURANCE

## 2012-04-03 VITALS — BP 142/70 | HR 70 | Temp 97.6°F | Ht 60.0 in | Wt 143.6 lb

## 2012-04-03 DIAGNOSIS — I10 Essential (primary) hypertension: Secondary | ICD-10-CM

## 2012-04-03 DIAGNOSIS — K5730 Diverticulosis of large intestine without perforation or abscess without bleeding: Secondary | ICD-10-CM

## 2012-04-03 DIAGNOSIS — M810 Age-related osteoporosis without current pathological fracture: Secondary | ICD-10-CM

## 2012-04-03 DIAGNOSIS — I872 Venous insufficiency (chronic) (peripheral): Secondary | ICD-10-CM

## 2012-04-03 DIAGNOSIS — R911 Solitary pulmonary nodule: Secondary | ICD-10-CM

## 2012-04-03 DIAGNOSIS — E7800 Pure hypercholesterolemia, unspecified: Secondary | ICD-10-CM

## 2012-04-03 DIAGNOSIS — F411 Generalized anxiety disorder: Secondary | ICD-10-CM

## 2012-04-03 DIAGNOSIS — K219 Gastro-esophageal reflux disease without esophagitis: Secondary | ICD-10-CM

## 2012-04-03 DIAGNOSIS — M1990 Unspecified osteoarthritis, unspecified site: Secondary | ICD-10-CM

## 2012-04-03 DIAGNOSIS — N200 Calculus of kidney: Secondary | ICD-10-CM

## 2012-04-03 LAB — BASIC METABOLIC PANEL
BUN: 15 mg/dL (ref 6–23)
Calcium: 9.8 mg/dL (ref 8.4–10.5)
Creatinine, Ser: 1.1 mg/dL (ref 0.4–1.2)
GFR: 55.1 mL/min — ABNORMAL LOW (ref >60.00)

## 2012-04-03 NOTE — Progress Notes (Addendum)
Subjective:    Patient ID: Mary [name], female    DOB: March 12, 1941, 71 y.o.   MRN: [id number]  HPI 71 y/o WF here for a follow up visit, she is Mary [name]'s sister... she has HBP, Hypercholesterolemia, & Osteopenia- on meds below...   ~  October 05, 2009:  sh's had another good yr- no new complaints or concerns... she had Flu shot 10/10, BP controlled on meds, Chol reasonable on Simva20, continues on Fosamax, calcium, etc...  ~  October 05, 2010:  Yearly follow up doing well w/o new complaints or concerns... she sees DrHorvath for GYN & was started on Oxybutynin for stress incontinence w/ min improvement (she has appt w/ DrGrapey to consider surg)... she's been on Alendronate for yrs 7 we discussed stopping this med for a "drug holiday" over the next yr w/ repeat BMD planned 2/13...  BP remains well controlled;  Chol looks good on med;  she had her f/u colonoscopy 3/11 by DrStark w/ divertics only, f/u 73yrs.  ~  March 02, 2011:  123mo ROV> BP remains under good control w/ Ziac & Lisinopril;  Chol continues to look good on Simva20;  Ortho stable & her Alendronate is on hold for drug holiday...    <Kidney Stone>  she developed sudden right flank pain 01/27/11 w/ N & V, went to the ER where she was diagnosed w/ right ureteral stone; treated w/ pain meds, fluids, & Keflex for infection; she was already sched for incontinence surg by DrGrapey- so on 02/13/11 he did a cysto, retrograde pyelogram, holmium laser lithotripsy w/ basketing of the fragments, double J stent, AND retropubic suburethral sling;  She reports great result- stone ablated & incont resolved...     <49mm Right base pulm nodule>  during the ER eval for the kidney stone she had a CT Abd that showed an incidental 9mm nodule right lung base (?RML); she is a never smoker, no hx lung dis or pulm problems other than the occas bout of bronchitis; last CXR was 1/10= clear, NAD; she denies cough, sputum, hemoptysis, SOB, etc; she exercises by  walking (no DOE) & cares for her blind 2y/o grandson;  We discussed further eval w/ CT Chest ==> confirms 8mm RML nodule, 4mm LLL nodule, no adenopathy, heterogeneous thyroid, DJD spine...  We discussed options and decided on watchful waiting w/ CXR in 23mo & repeat CT Chest in 6-49mo...  ~  October 06, 2011:  23mo ROV & she reports a good interval but had recent bronchitic exac & [facility] (given [facility] & cough syrup- didn't bring bottles); now improved> f/u CXR today shows clear lungs, the 8mm RML nodule seen on CXR cannot be seen on the current film & we discussed f/u in 23mo w/ another CT at that time...  She is due for Fasting blood work today> see below; she had 2012 Flu vaccine 9/12, & requests refill prescriptions for 90d supplies...  ~  April 03, 2012:  23mo ROV & Mary [name] is feeling well- no new complaints or concerns; specifically she denies cough, sput, hemoptysis, SOB, chest discomfort, etc... Incidental 8-77mm right base nodule seen on CTAbd 5/12 as part of w/u for kidney stone; CTChest confirmed & showed 4mm LLL nodule as well; she is low risk, non-smoker & interval CXR was clear- due for 59mo f/u CT at this time...    We reviewed prob list, meds, xrays and labs> see below for updates >> BMet 7/13 is wnl... CT Chest  8/13 showed ==> right lung nodule measures 8mm, no new nodules or adenopathy, bilat thyroid nodules noted, simple cyst in liver...           Problem List:  Hx of SINUSITIS (ICD-473.9)                                                  Hx of BRONCHITIS (ICD-490) >> ~  1/13:  She reports recent bronchitic exac & [facility]> treated w/ 2 antibiotics ?which ones, and cough syrup; symptoms resolved & back to baseline...  PULMONARY NODULE>>  ~  6/12:  Discovered during the 5/12 ER eval for kidney stone; she had CT Abd that showed an incidental 9mm nodule right lung base (?RML); she is a never smoker, no hx lung dis or pulm problems other than the occas bout of  bronchitis; last CXR was 1/10= clear, NAD; she denies cough, sputum, hemoptysis, SOB, etc; she exercises by walking (no DOE) & cares for her blind 2 y/o grandson;  We discussed further eval w/ CT Chest. ~  7/12:  CT Chest w/ contrast confirms 8mm RML nodule, 4mm LLL nodule, no adenopathy, heterogeneous thyroid, DJD spine... ~  2/13:  CXR is clear, no infiltrates, nodules, adenopathy, etc... ~  CT Chest 8/13 showed ==> right lung nodule measures 8mm, no new nodules or adenopathy, bilat thyroid nodules noted, simple cyst in liver...   HYPERTENSION (ICD-401.9) - on ASA 81mg /d,  LISINOPRIL 20mg /d, & ZIAC 5mg /d...  ~  6/12:  BP today= 144/76, takes med regularly, tolerates well> denies HA, fatigue, visual changes, CP, palpit, dizziness, syncope, dyspnea, edema, etc... ~  2/13:  BP= 140/80 & she remains asymptomatic... ~  7/13:  BP= 142/70 & she denies CP, palpit, SOB, edema, etc...  VENOUS INSUFFICIENCY (ICD-459.81) >> she knows to elim sodium, elev legs, & wear support hose...  HYPERCHOLESTEROLEMIA (ICD-272.0) - on SIMVASTATIN 20mg /d & tol well. ~  FLP 2/08 on Lip10 showed TChol 188, TG 176, HDL 63, LDL 90 ~  FLP 1/10 on Simva20 showed TChol 188, TG 108, HDL 59, LDL 107 ~  FLP 2/11 on Simva20 showed TChol 168, TG 181, HDL 53, LDL 79 ~  FLP 2/12 on Simva20 showed TChol 164, TG 120, HDL 46, LDL 94 ~  FLP 2/13 on Simva20 showed TChol 191, TG 177, HDL 52, LDL 103  GERD (ICD-530.81) - she uses OTC Prilosec as needed... denies nausea, vomiting, heartburn, diarrhea, constipation, blood in stool, abdominal pain, swelling, gas...  DIVERTICULOSIS OF COLON (ICD-562.10) - she is essent asymptomatic... ~  colonoscopy 12/00 by DrStark was neg> f/u planned 68yrs. ~  colonoscopy 3/11 by DrStark showed mild divertics, otherw neg.  Hx of BREAST CYST (ICD-610.0) - benign breast cyst asp 1989, she gets yearly mammogram at [facility].  KIDNEY STONES>>  ~  6/12:  she developed sudden right flank pain 01/27/11 w/ N  & V, went to the ER where she was diagnosed w/ right ureteral stone; treated w/ pain meds, fluids, & Keflex for infection; she was already sched for incontinence surg by DrGrapey- so on 02/13/11 he did a cysto, retrograde pyelogram, holmium laser lithotripsy w/ basketing of the fragments, double J stent, AND retropubic suburethral sling;  She reports great result- stone ablated & incont resolved...  ~  7/13:  Pt reports that DrGrapey saw 2 sm stones in  kidney but currently asymptomatic...  DEGENERATIVE JOINT DISEASE (ICD-715.90) Hx of DEGENERATIVE DISC DISEASE (ICD-722.6) - s/p lumbar laminectomy, no signif LBP now & walks some... she has DJD but not significantly limited and uses OTC meds Prn.  CARPAL TUNNEL SYNDROME, BILATERAL (ICD-354.0) - she has seen DrAplington in the past; uses wrist splints qHS- had right carpel tunnel release, & right rotator cuff surg...  OSTEOPOROSIS (ICD-733.00) - on Calcium, Vits, & Vit D; prev on Alendronate x yrs & placed on hold 2/12 for drug holiday... ~  BMD 12/07 showed TScores -0.8 to -1.9 ( sl better spine, sl worse hips)... rec- meds w/ bisphos etc ~  labs 2/08 w/ Vit D level = 34... rec to take Vit D 1000 u OTC daily... ~  BMD 1/10 showed TScores +0.2 in Spine, & -1.9 in right FemNeck... rec> continue Rx. ~  labs 1/10 showed Vit D level = 39... rec> continue 1000 u OTC daily. ~  labs 2/12 showed Vit D level = 52... continue same; but we decided on a Bisphos drug holiday for the next yr. ~  Labs  2/13 showed Vit D level = 59... Continue same.  ANXIETY (ICD-300.00)  HEALTH MAINTANENCE = all up-to-date. ~  COLONOSCOPY:  done 3/11 by DrStark & neg x for divertics... ~  GYN:  DrHorvath - on Estradiol + Medroxyprogesterone... ~  MAMMOGRAM:  at [facility] yearly... ~  BMD:  done 1/10 here w/ OSTEOPENIA, TScores +0.2  to -1.9, sl better in spine & sl worse in hips. ~  VACCINATIONS:  she gets the yearly Flu vaccine; PNEUMOVAX 11/99 & 1/10 here,  TETANUS shot 11/99  & 1/10 here...  ~  NOTE:  she takes ACYCLOVIR 400Tid x5d as needed for HSV1 outbreaks...   Past Surgical History  Procedure Date  . Right rotator cuff surgery 04/2005    Dr. Leslee Home  . Laminectomy and microdiscectomy lumbar spine 02/2002    Dr. Leslee Home  . Holmium laser lithotripsy for right ureteral stone 6/12    by DrGrapey  . Retropubic suburethral sling for urinary incontinence 6/12     by DrGrapey    Outpatient Encounter Prescriptions as of 04/03/2012  Medication Sig Dispense Refill  . acyclovir (ZOVIRAX) 400 MG tablet TAKE 1 TABLET THREE TIMES A DAY FOR 5 DAYS AS DIRECTED FOR FEVER BLISTERS  30 tablet  1  . aspirin 81 MG tablet Take 81 mg by mouth daily.        . bisoprolol-hydrochlorothiazide (ZIAC) 5-6.25 MG per tablet TAKE 1 TABLET EVERY DAY  90 tablet  3  . Calcium Carbonate-Vit D-Min (CALTRATE 600+D PLUS) 600-400 MG-UNIT per tablet Take 1 tablet by mouth daily.        . cholecalciferol (VITAMIN D) 1000 UNITS tablet Take 1,000 Units by mouth daily.        Marland Kitchen estradiol (ESTRACE) 0.5 MG tablet Take 0.5 mg by mouth daily. As directed by Dr. Henderson Cloud       . lisinopril (PRINIVIL,ZESTRIL) 20 MG tablet TAKE 1 TABLET EVERY DAY  90 tablet  3  . medroxyPROGESTERone (PROVERA) 2.5 MG tablet Take 2.5 mg by mouth daily. As directed by Dr. Henderson Cloud       . Multiple Vitamins-Minerals (WOMENS MULTIVITAMIN PLUS) TABS Take 1 tablet by mouth daily.        . simvastatin (ZOCOR) 20 MG tablet TAKE 1 TABLET AT BEDTIME  90 tablet  3  . vitamin C (ASCORBIC ACID) 500 MG tablet Take 500 mg by mouth daily.  Allergies  Allergen Reactions  . Sulfonamide Derivatives     REACTION: nausea    Review of Systems       The patient denies fever, chills, sweats, anorexia, fatigue, weakness, malaise, weight loss, sleep disorder, blurring, diplopia, eye irritation, eye discharge, vision loss, eye pain, photophobia, earache, ear discharge, tinnitus, decreased hearing, nasal congestion, nosebleeds, sore  throat, hoarseness, chest pain, palpitations, syncope, dyspnea on exertion, orthopnea, PND, peripheral edema, cough, dyspnea at rest, excessive sputum, hemoptysis, wheezing, pleurisy, nausea, vomiting, diarrhea, constipation, change in bowel habits, abdominal pain, melena, hematochezia, jaundice, gas/bloating, indigestion/heartburn, dysphagia, odynophagia, dysuria, hematuria, urinary frequency, urinary hesitancy, nocturia, back pain, joint pain, joint swelling, muscle cramps, muscle weakness, stiffness, sciatica, restless legs, leg pain at night, leg pain with exertion, rash, itching, dryness, suspicious lesions, paralysis, paresthesias, seizures, tremors, vertigo, transient blindness, frequent falls, frequent headaches, difficulty walking, depression, anxiety, memory loss, confusion, cold intolerance, heat intolerance, polydipsia, polyphagia, polyuria, unusual weight change, abnormal bruising, bleeding, enlarged lymph nodes, urticaria, allergic rash, hay fever, and recurrent infections.     Objective:   Physical Exam     WD, WN, 71 y/o WF in NAD... GENERAL:  Alert & oriented; pleasant & cooperative... HEENT:  [place]/AT, EOM-wnl, PERRLA, Fundi-benign, EACs-clear, TMs-wnl, NOSE-clear, THROAT-clear & wnl. NECK:  Supple w/ full ROM; no JVD; normal carotid impulses w/o bruits; no thyromegaly or nodules palpated; no lymphadenopathy. CHEST:  Clear to P & A; without wheezes/ rales/ or rhonchi. HEART:  Regular Rhythm; without murmurs/ rubs/ or gallops. ABDOMEN:  Soft & nontender; normal bowel sounds; no organomegaly or masses detected. EXT: without deformities, mild arthritic changes; no varicose veins/ venous insuffic/ or edema. NEURO:  CN's intact; motor testing normal; sensory testing normal; gait normal & balance OK. DERM:  No lesions noted; no rash etc...  RADIOLOGY DATA:  Reviewed in the EPIC EMR & discussed w/ the patient...  LABORATORY DATA:  Reviewed in the EPIC EMR & discussed w/ the  patient...   Assessment & Plan:   PULM NODULE>   8mm RML nodule seen on CT scan, not visible on CXRs to date; she is low risk (never smoker etc) & f/u CT Chest due now ==> no change in 8mm nodule & Radiology rec f/u CT in 18-25mo...   Hx Sinusitis & Bronchitis>  Recent [facility]itic exac [facility] w/ ?antibiotics & cough syrup- resolved...  HBP>  Controlled on meds...  CHOL>  Stable on the Simva20; we reviewed diet...  GI>  GERD, Divertics>  Stable & up to date...  DJD, Osteopenia>  Stable & doing well; plan f/u BMD after 1/13 for recheck...  Anxiety>  She is handling the stress well, does not request anxiolytics...   Patient's Medications  New Prescriptions   No medications on file  Previous Medications   ACYCLOVIR (ZOVIRAX) 400 MG TABLET    TAKE 1 TABLET THREE TIMES A DAY FOR 5 DAYS AS DIRECTED FOR FEVER BLISTERS   ASPIRIN 81 MG TABLET    Take 81 mg by mouth daily.     BISOPROLOL-HYDROCHLOROTHIAZIDE (ZIAC) 5-6.25 MG PER TABLET    TAKE 1 TABLET EVERY DAY   CALCIUM CARBONATE-VIT D-MIN (CALTRATE 600+D PLUS) 600-400 MG-UNIT PER TABLET    Take 1 tablet by mouth daily.     CHOLECALCIFEROL (VITAMIN D) 1000 UNITS TABLET    Take 1,000 Units by mouth daily.     ESTRADIOL (ESTRACE) 0.5 MG TABLET    Take 0.5 mg by mouth daily. As directed by Dr. Henderson Cloud  LISINOPRIL (PRINIVIL,ZESTRIL) 20 MG TABLET    TAKE 1 TABLET EVERY DAY   MEDROXYPROGESTERONE (PROVERA) 2.5 MG TABLET    Take 2.5 mg by mouth daily. As directed by Dr. Henderson Cloud    MULTIPLE VITAMINS-MINERALS (WOMENS MULTIVITAMIN PLUS) TABS    Take 1 tablet by mouth daily.     SIMVASTATIN (ZOCOR) 20 MG TABLET    TAKE 1 TABLET AT BEDTIME   VITAMIN C (ASCORBIC ACID) 500 MG TABLET    Take 500 mg by mouth daily.    Modified Medications   No medications on file  Discontinued Medications   No medications on file

## 2012-04-03 NOTE — Patient Instructions (Addendum)
Today we updated your med list in our EPIC system...    Continue your current medications the same...  We will arrange for a follow up CT Chest to compare to the one done 7/12...    We will need to check your renal function first...  We will call you w/ the results when avail...  Call for any questions...  Let's plan a follow up recheck in 6 months.Marland KitchenMarland Kitchen

## 2012-04-08 ENCOUNTER — Other Ambulatory Visit: Payer: PRIVATE HEALTH INSURANCE

## 2012-04-09 ENCOUNTER — Ambulatory Visit (INDEPENDENT_AMBULATORY_CARE_PROVIDER_SITE_OTHER)
Admission: RE | Admit: 2012-04-09 | Discharge: 2012-04-09 | Disposition: A | Discharge status: Home / Self Care | Payer: PRIVATE HEALTH INSURANCE | Source: Ambulatory Visit | Attending: Pulmonary Disease | Admitting: Pulmonary Disease

## 2012-04-09 DIAGNOSIS — R911 Solitary pulmonary nodule: Secondary | ICD-10-CM

## 2012-04-09 MED ORDER — IOHEXOL 300 MG/ML  SOLN
80.0000 mL | Freq: Once | INTRAMUSCULAR | Status: AC | PRN
  Administered 2012-04-09: 80 mL via INTRAVENOUS

## 2012-06-04 ENCOUNTER — Telehealth: Payer: Self-pay | Admitting: Pulmonary Disease

## 2012-06-04 NOTE — Telephone Encounter (Signed)
We have flu shots now- she needs to be put on allergy schedule for this.  ATC and inform the pt, NA and no option to leave a msg, [facility]

## 2012-06-05 NOTE — Telephone Encounter (Signed)
Pt aware we have flu vaccine and will come by on Thurs., 06/06/12 to receive the injection in our allergy lab.

## 2012-06-06 ENCOUNTER — Ambulatory Visit (INDEPENDENT_AMBULATORY_CARE_PROVIDER_SITE_OTHER): Payer: PRIVATE HEALTH INSURANCE

## 2012-06-06 DIAGNOSIS — Z23 Encounter for immunization: Secondary | ICD-10-CM

## 2012-06-07 DIAGNOSIS — Z23 Encounter for immunization: Secondary | ICD-10-CM

## 2012-06-13 ENCOUNTER — Encounter: Payer: Self-pay | Admitting: Cardiology

## 2012-07-13 ENCOUNTER — Observation Stay (HOSPITAL_COMMUNITY)
Admission: EM | Admit: 2012-07-13 | Discharge: 2012-07-14 | Disposition: A | Discharge status: Home / Self Care | Payer: Medicare Other | Attending: Urology | Admitting: Urology

## 2012-07-13 ENCOUNTER — Emergency Department (HOSPITAL_COMMUNITY): Payer: Medicare Other

## 2012-07-13 ENCOUNTER — Other Ambulatory Visit: Payer: Self-pay

## 2012-07-13 ENCOUNTER — Encounter (HOSPITAL_COMMUNITY): Payer: Self-pay | Admitting: Emergency Medicine

## 2012-07-13 DIAGNOSIS — I10 Essential (primary) hypertension: Secondary | ICD-10-CM | POA: Insufficient documentation

## 2012-07-13 DIAGNOSIS — E7800 Pure hypercholesterolemia, unspecified: Secondary | ICD-10-CM | POA: Insufficient documentation

## 2012-07-13 DIAGNOSIS — N132 Hydronephrosis with renal and ureteral calculous obstruction: Secondary | ICD-10-CM

## 2012-07-13 DIAGNOSIS — N1330 Unspecified hydronephrosis: Secondary | ICD-10-CM | POA: Insufficient documentation

## 2012-07-13 DIAGNOSIS — K219 Gastro-esophageal reflux disease without esophagitis: Secondary | ICD-10-CM | POA: Insufficient documentation

## 2012-07-13 DIAGNOSIS — M810 Age-related osteoporosis without current pathological fracture: Secondary | ICD-10-CM | POA: Insufficient documentation

## 2012-07-13 DIAGNOSIS — K7689 Other specified diseases of liver: Secondary | ICD-10-CM | POA: Insufficient documentation

## 2012-07-13 DIAGNOSIS — N201 Calculus of ureter: Principal | ICD-10-CM | POA: Insufficient documentation

## 2012-07-13 DIAGNOSIS — R911 Solitary pulmonary nodule: Secondary | ICD-10-CM | POA: Insufficient documentation

## 2012-07-13 LAB — CBC
HCT: 39.5 % (ref 36.0–46.0)
Hemoglobin: 13.4 g/dL (ref 12.0–15.0)
MCV: 90.8 fL (ref 78.0–100.0)
RBC: 4.35 MIL/uL (ref 3.87–5.11)
WBC: 10.4 10*3/uL (ref 4.0–10.5)

## 2012-07-13 LAB — URINALYSIS, ROUTINE W REFLEX MICROSCOPIC
Glucose, UA: NEGATIVE mg/dL
Protein, ur: NEGATIVE mg/dL
Urobilinogen, UA: 1 mg/dL (ref 0.0–1.0)

## 2012-07-13 LAB — COMPREHENSIVE METABOLIC PANEL
Alkaline Phosphatase: 89 U/L (ref 39–117)
BUN: 12 mg/dL (ref 6–23)
Calcium: 10.6 mg/dL — ABNORMAL HIGH (ref 8.4–10.5)
Creatinine, Ser: 0.78 mg/dL (ref 0.50–1.10)
GFR calc Af Amer: >90 mL/min (ref >90)
Glucose, Bld: 131 mg/dL — ABNORMAL HIGH (ref 70–99)
Potassium: 3.6 mEq/L (ref 3.5–5.1)
Total Protein: 7.7 g/dL (ref 6.0–8.3)

## 2012-07-13 LAB — URINE MICROSCOPIC-ADD ON

## 2012-07-13 LAB — CBC WITH DIFFERENTIAL/PLATELET
Basophils Relative: 0 % (ref 0–1)
Eosinophils Absolute: 0.1 10*3/uL (ref 0.0–0.7)
Eosinophils Relative: 1 % (ref 0–5)
HCT: 43.9 % (ref 36.0–46.0)
Hemoglobin: 14.8 g/dL (ref 12.0–15.0)
Lymphs Abs: 1.7 10*3/uL (ref 0.7–4.0)
MCH: 30.9 pg (ref 26.0–34.0)
MCHC: 33.7 g/dL (ref 30.0–36.0)
MCV: 91.6 fL (ref 78.0–100.0)
Monocytes Absolute: 1 10*3/uL (ref 0.1–1.0)
Monocytes Relative: 7 % (ref 3–12)
RBC: 4.79 MIL/uL (ref 3.87–5.11)

## 2012-07-13 LAB — BASIC METABOLIC PANEL
BUN: 11 mg/dL (ref 6–23)
CO2: 28 mEq/L (ref 19–32)
Chloride: 100 mEq/L (ref 96–112)
Creatinine, Ser: 0.86 mg/dL (ref 0.50–1.10)
GFR calc Af Amer: 78 mL/min — ABNORMAL LOW (ref >90)
Potassium: 3.5 mEq/L (ref 3.5–5.1)

## 2012-07-13 MED ORDER — SODIUM CHLORIDE 0.45 % IV SOLN
INTRAVENOUS | Status: DC
  Administered 2012-07-13 – 2012-07-14 (×2): via INTRAVENOUS

## 2012-07-13 MED ORDER — ACETAMINOPHEN 10 MG/ML IV SOLN
1000.0000 mg | Freq: Four times a day (QID) | INTRAVENOUS | Status: DC
  Administered 2012-07-13 – 2012-07-14 (×3): 1000 mg via INTRAVENOUS
  Filled 2012-07-13 (×4): qty 100

## 2012-07-13 MED ORDER — KETOROLAC TROMETHAMINE 15 MG/ML IJ SOLN: 15.0000 mg | Freq: Four times a day (QID) | INTRAMUSCULAR | Status: DC | PRN

## 2012-07-13 MED ORDER — HYDROMORPHONE HCL PF 1 MG/ML IJ SOLN: 1.0000 mg | INTRAMUSCULAR | Status: DC | PRN

## 2012-07-13 MED ORDER — ONDANSETRON HCL 4 MG/2ML IJ SOLN
4.0000 mg | Freq: Four times a day (QID) | INTRAMUSCULAR | Status: DC
  Administered 2012-07-13 – 2012-07-14 (×4): 4 mg via INTRAVENOUS
  Filled 2012-07-13 (×4): qty 2

## 2012-07-13 MED ORDER — ONDANSETRON HCL 4 MG/2ML IJ SOLN: 4.0000 mg | Freq: Three times a day (TID) | INTRAMUSCULAR | Status: AC | PRN

## 2012-07-13 MED ORDER — DEXTROSE-NACL 5-0.45 % IV SOLN
INTRAVENOUS | Status: DC
  Administered 2012-07-13 (×2): via INTRAVENOUS

## 2012-07-13 MED ORDER — DEXTROSE 5 % IV SOLN
1.0000 g | Freq: Once | INTRAVENOUS | Status: AC
  Administered 2012-07-13: 1 g via INTRAVENOUS
  Filled 2012-07-13: qty 10

## 2012-07-13 MED ORDER — CIPROFLOXACIN HCL 250 MG PO TABS
250.0000 mg | ORAL_TABLET | Freq: Two times a day (BID) | ORAL | Status: DC
  Administered 2012-07-13 – 2012-07-14 (×2): 250 mg via ORAL
  Filled 2012-07-13 (×5): qty 1

## 2012-07-13 NOTE — ED Notes (Signed)
Pt presents w/ right flank pain, lower abdominal pain and perineal pressure. Nausea w/ emesis last p.m. Dysuria and urgency since Thursday. Pt has talked w/ Dr. Patsi Sears and he has called in orders for pt.

## 2012-07-13 NOTE — ED Notes (Signed)
Called to give report, nurse is currently discharging a pt, will return phone call.

## 2012-07-13 NOTE — H&P (Signed)
History and Physical  Chief Complaint:  Back pain and gross hematuria  History of Present Illness:  Chief Complaint   Patient presents with   .  Flank Pain     HPI  Pt with R flank pain since Wednsday, urinary hesitancy and frequency since Thursday. No fever chills, gross hematuria. Pt states flank pain was worse  Friday night , with  +nasea and vomiting. Pain is similar to previous renal stones. ED evaluation shows lower ureteral stone , hydronephrosis. She is admitted pro-op for antibiotics, hydration, and stone removal in AM.  Past Medical History   Diagnosis  Date   .  History of sinusitis    .  Bronchitis    .  Hypertension    .  Venous insufficiency    .  Hypercholesteremia    .  GERD (gastroesophageal reflux disease)    .  Diverticulosis of colon    .  History of cyst of breast    .  DJD (degenerative joint disease)    .  DDD (degenerative disc disease)    .  Carpal tunnel syndrome      bilateral   .  Osteoporosis    .  Anxiety    .  Kidney stones       Past Medical History  Diagnosis Date  . History of sinusitis   . Bronchitis   . Hypertension   . Venous insufficiency   . Hypercholesteremia   . GERD (gastroesophageal reflux disease)   . Diverticulosis of colon   . History of cyst of breast   . DJD (degenerative joint disease)   . DDD (degenerative disc disease)   . Carpal tunnel syndrome     bilateral  . Osteoporosis   . Anxiety   . Kidney stones    Past Surgical History  Procedure Date  . Right rotator cuff surgery 04/2005    Dr. Leslee Home  . Laminectomy and microdiscectomy lumbar spine 02/2002    Dr. Leslee Home  . Holmium laser lithotripsy for right ureteral stone 6/12    by DrGrapey  . Retropubic suburethral sling for urinary incontinence 6/12     by DrGrapey    Medications: I have reviewed the patient's current medications. Allergies:  Allergies  Allergen Reactions  . Sulfonamide Derivatives     REACTION: nausea    No family history on  file. Social History:  reports that she has never smoked. She has never used smokeless tobacco. She reports that she does not drink alcohol or use illicit drugs.  ROS: All systems are reviewed and negative except as noted.   Physical Exam:  Vital signs in last 24 hours: Temp:  [98.5 F (36.9 C)] 98.5 F (36.9 C) (11/09 1541) Pulse Rate:  [79-80] 80  (11/09 1541) Resp:  [16-18] 16  (11/09 1541) BP: (162-182)/(60-87) 162/60 mmHg (11/09 1541) SpO2:  [96 %-97 %] 97 % (11/09 1541) Weight:  [67.631 kg (149 lb 1.6 oz)] 67.631 kg (149 lb 1.6 oz) (11/09 1541)  Cardiovascular: Skin warm; not flushed Respiratory: Breaths quiet; no shortness of breath Abdomen: No masses Neurological: Normal sensation to touch Musculoskeletal: Normal motor function arms and legs Lymphatics: No inguinal adenopathy Skin: No rashes Genitourinary:normal BUS  Laboratory Data:  Results for orders placed during the hospital encounter of 07/13/12 (from the past 24 hour(s))  URINALYSIS, ROUTINE W REFLEX MICROSCOPIC     Status: Abnormal   Collection Time   07/13/12 11:34 AM      Component Value Range  Color, Urine ORANGE (*) YELLOW   APPearance CLOUDY (*) CLEAR   Specific Gravity, Urine 1.017  1.005 - 1.030   pH 7.5  5.0 - 8.0   Glucose, UA NEGATIVE  NEGATIVE mg/dL   Hgb urine dipstick SMALL (*) NEGATIVE   Bilirubin Urine NEGATIVE  NEGATIVE   Ketones, ur NEGATIVE  NEGATIVE mg/dL   Protein, ur NEGATIVE  NEGATIVE mg/dL   Urobilinogen, UA 1.0  0.0 - 1.0 mg/dL   Nitrite POSITIVE (*) NEGATIVE   Leukocytes, UA SMALL (*) NEGATIVE  URINE MICROSCOPIC-ADD ON     Status: Abnormal   Collection Time   07/13/12 11:34 AM      Component Value Range   Squamous Epithelial / LPF RARE  RARE   WBC, UA 3-6  <3 WBC/hpf   RBC / HPF 3-6  <3 RBC/hpf   Bacteria, UA FEW (*) RARE   Urine-Other AMORPHOUS URATES/PHOSPHATES    CBC WITH DIFFERENTIAL     Status: Abnormal   Collection Time   07/13/12 12:00 PM      Component Value Range     WBC 13.6 (*) 4.0 - 10.5 K/uL   RBC 4.79  3.87 - 5.11 MIL/uL   Hemoglobin 14.8  12.0 - 15.0 g/dL   HCT 95.2  84.1 - 32.4 %   MCV 91.6  78.0 - 100.0 fL   MCH 30.9  26.0 - 34.0 pg   MCHC 33.7  30.0 - 36.0 g/dL   RDW 40.1  02.7 - 25.3 %   Platelets 281  150 - 400 K/uL   Neutrophils Relative 79 (*) 43 - 77 %   Neutro Abs 10.8 (*) 1.7 - 7.7 K/uL   Lymphocytes Relative 13  12 - 46 %   Lymphs Abs 1.7  0.7 - 4.0 K/uL   Monocytes Relative 7  3 - 12 %   Monocytes Absolute 1.0  0.1 - 1.0 K/uL   Eosinophils Relative 1  0 - 5 %   Eosinophils Absolute 0.1  0.0 - 0.7 K/uL   Basophils Relative 0  0 - 1 %   Basophils Absolute 0.0  0.0 - 0.1 K/uL  COMPREHENSIVE METABOLIC PANEL     Status: Abnormal   Collection Time   07/13/12 12:00 PM      Component Value Range   Sodium 138  135 - 145 mEq/L   Potassium 3.6  3.5 - 5.1 mEq/L   Chloride 99  96 - 112 mEq/L   CO2 25  19 - 32 mEq/L   Glucose, Bld 131 (*) 70 - 99 mg/dL   BUN 12  6 - 23 mg/dL   Creatinine, Ser 6.64  0.50 - 1.10 mg/dL   Calcium 40.3 (*) 8.4 - 10.5 mg/dL   Total Protein 7.7  6.0 - 8.3 g/dL   Albumin 3.9  3.5 - 5.2 g/dL   AST 39 (*) 0 - 37 U/L   ALT 41 (*) 0 - 35 U/L   Alkaline Phosphatase 89  39 - 117 U/L   Total Bilirubin 0.5  0.3 - 1.2 mg/dL   GFR calc non Af Amer 83 (*) >90 mL/min   GFR calc Af Amer >90  >90 mL/min   No results found for this or any previous visit (from the past 240 hour(s)). Creatinine:  Basename 07/13/12 1200  CREATININE 0.78    Xrays:  CT ABDOMEN AND PELVIS WITHOUT CONTRAST  Technique: Multidetector CT imaging of the abdomen and pelvis was  performed following the standard protocol without  intravenous  contrast.  Comparison: CT scan 01/27/2011  Findings: Sagittal images of the spine shows disc space flattening  with endplate sclerotic changes and mild anterior spurring at L4-L5  level.  Lung bases shows stable 9 mm nodule in the right lower lobe.  Unenhanced liver shows no biliary ductal  dilatation. A cyst in  caudate lobe of the liver measures 3.4 cm stable from prior exam.  No calcified gallstones are noted within gallbladder. Pancreas,  spleen and adrenal glands are unremarkable. There is moderate  right hydronephrosis and right hydroureter. Nonobstructive  calcified calculus in the lower pole of the right kidney measures  1.9 mm.  Nonobstructive calcified calculus in lower pole of the left kidney  measures 2 mm. Bilateral no proximal ureteral calculi are noted.  Retroflexed uterus is noted. No small bowel obstruction. No  ascites or free air. No adenopathy. No pericecal inflammation.  In axial image 69 there is calcified obstructive calculus in distal  right ureter measures 8.5 millimeters by 4.7 mm. This is located  about 5 mm from right UVJ. Distal left ureter is unremarkable. No  calcified calculi are noted within urinary bladder.  IMPRESSION:  1. There is moderate right hydronephrosis and right hydroureter.  2. Bilateral nonobstructive nephrolithiasis.  3. There is a 8.5 x 5 mm calcified obstructive calculus in distal  right ureter about 5 mm from right UVJ.  4. Stable 9 mm nodule in the right lower lobe.  5. Stable cyst in caudate lobe of the liver.  Original Report Authenticated By: Natasha Mead, M.D.    Impression/Assessment:   Right lower ureteral stone , symptomatic, with hydronephrosis abnd flank pain. Pt is from [place], and has had recurrent attacks of pain. We will admit, and proceed to OR in AM for cysto, laser of distal stone, and  JJ stent.  Plan:    Admit, pain and nausea control. For OR in AM.   Joshaua Epple I 07/13/2012, 4:18 PM

## 2012-07-13 NOTE — ED Provider Notes (Signed)
History     CSN: [id number]  Arrival date & time 07/13/12  4098   First MD Initiated Contact with Patient 07/13/12 1120      Chief Complaint  Patient presents with  . Flank Pain    (Consider location/radiation/quality/duration/timing/severity/associated sxs/prior treatment) HPI Pt with R flank pain, urinary hesitancy and frequency since Thursday. No fever chills, gross hematuria. Pt states flank pain was worse last night and has now eased off. +nasea and vomiting. Pain is similar to previous renal stones.  Past Medical History  Diagnosis Date  . History of sinusitis   . Bronchitis   . Hypertension   . Venous insufficiency   . Hypercholesteremia   . GERD (gastroesophageal reflux disease)   . Diverticulosis of colon   . History of cyst of breast   . DJD (degenerative joint disease)   . DDD (degenerative disc disease)   . Carpal tunnel syndrome     bilateral  . Osteoporosis   . Anxiety   . Kidney stones     Past Surgical History  Procedure Date  . Right rotator cuff surgery 04/2005    Dr. Leslee Home  . Laminectomy and microdiscectomy lumbar spine 02/2002    Dr. Leslee Home  . Holmium laser lithotripsy for right ureteral stone 6/12    by DrGrapey  . Retropubic suburethral sling for urinary incontinence 6/12     by DrGrapey    No family history on file.  History  Substance Use Topics  . Smoking status: Never Smoker   . Smokeless tobacco: Never Used  . Alcohol Use: No    OB History    Grav Para Term Preterm Abortions TAB SAB Ect Mult Living                  Review of Systems  Constitutional: Negative for fever and chills.  Respiratory: Negative for shortness of breath.   Cardiovascular: Negative for chest pain.  Gastrointestinal: Positive for nausea and vomiting. Negative for abdominal pain, diarrhea and rectal pain.  Genitourinary: Positive for frequency, flank pain and difficulty urinating. Negative for dysuria and hematuria.  Musculoskeletal: Positive  for back pain.  Skin: Negative for rash and wound.  Neurological: Negative for dizziness, weakness, light-headedness, numbness and headaches.    Allergies  Sulfonamide derivatives  Home Medications   Current Outpatient Rx  Name  Route  Sig  Dispense  Refill  . ASPIRIN 81 MG PO TABS   Oral   Take 81 mg by mouth daily.           Marland Kitchen BISOPROLOL-HYDROCHLOROTHIAZIDE 5-6.25 MG PO TABS   Oral   Take 1 tablet by mouth daily.         Marland Kitchen CALTRATE 600+D PLUS 600-400 MG-UNIT PO TABS   Oral   Take 1 tablet by mouth daily.           Marland Kitchen VITAMIN D 1000 UNITS PO TABS   Oral   Take 1,000 Units by mouth daily.           Marland Kitchen LISINOPRIL 20 MG PO TABS   Oral   Take 20 mg by mouth daily.         . WOMENS MULTIVITAMIN PLUS PO TABS   Oral   Take 1 tablet by mouth daily.           Marland Kitchen PHENAZOPYRIDINE HCL 95 MG PO TABS   Oral   Take 95 mg by mouth 3 (three) times daily as needed. Bladder irritation         .  SIMVASTATIN 20 MG PO TABS   Oral   Take 20 mg by mouth every evening.         Marland Kitchen VITAMIN C 500 MG PO TABS   Oral   Take 500 mg by mouth daily.             BP 182/87  Pulse 79  Temp 98.5 F (36.9 C) (Oral)  Resp 18  SpO2 96%  Physical Exam  Nursing note and vitals reviewed. Constitutional: She is oriented to person, place, and time. She appears well-developed and well-nourished. No distress.  HENT:  Head: Normocephalic and atraumatic.  Mouth/Throat: Oropharynx is clear and moist.  Eyes: EOM are normal. Pupils are equal, round, and reactive to light.  Neck: Normal range of motion. Neck supple.  Cardiovascular: Normal rate and regular rhythm.   Pulmonary/Chest: Effort normal and breath sounds normal. No respiratory distress. She has no wheezes. She has no rales.  Abdominal: Soft. Bowel sounds are normal. She exhibits no mass. There is no tenderness. There is no rebound and no guarding.  Musculoskeletal: Normal range of motion. She exhibits no edema and no  tenderness.       No flank tenderness to percussion  Neurological: She is alert and oriented to person, place, and time.  Skin: Skin is warm and dry. No rash noted. No erythema.  Psychiatric: She has a normal mood and affect. Her behavior is normal.    ED Course  Procedures (including critical care time)  Labs Reviewed  URINALYSIS, ROUTINE W REFLEX MICROSCOPIC - Abnormal; Notable for the following:    Color, Urine ORANGE (*)  BIOCHEMICALS MAY BE AFFECTED BY COLOR   APPearance CLOUDY (*)     Hgb urine dipstick SMALL (*)     Nitrite POSITIVE (*)     Leukocytes, UA SMALL (*)     All other components within normal limits  CBC WITH DIFFERENTIAL - Abnormal; Notable for the following:    WBC 13.6 (*)     Neutrophils Relative 79 (*)     Neutro Abs 10.8 (*)     All other components within normal limits  COMPREHENSIVE METABOLIC PANEL - Abnormal; Notable for the following:    Glucose, Bld 131 (*)     Calcium 10.6 (*)     AST 39 (*)     ALT 41 (*)     GFR calc non Af Amer 83 (*)     All other components within normal limits  URINE MICROSCOPIC-ADD ON - Abnormal; Notable for the following:    Bacteria, UA FEW (*)     All other components within normal limits  URINE CULTURE   Ct Abdomen Pelvis Wo Contrast  07/13/2012  *RADIOLOGY REPORT*  Clinical Data: Right flank pain  CT ABDOMEN AND PELVIS WITHOUT CONTRAST  Technique:  Multidetector CT imaging of the abdomen and pelvis was performed following the standard protocol without intravenous contrast.  Comparison: CT scan 01/27/2011  Findings: Sagittal images of the spine shows disc space flattening with endplate sclerotic changes and mild anterior spurring at L4-L5 level.  Lung bases shows stable 9 mm nodule in the right lower lobe.  Unenhanced liver shows no biliary ductal dilatation.  A cyst in caudate lobe of the liver measures 3.4 cm stable from prior exam.  No calcified gallstones are noted within gallbladder.  Pancreas, spleen and adrenal  glands are unremarkable.  There is moderate right hydronephrosis and right hydroureter.  Nonobstructive calcified calculus in the lower pole of the right  kidney measures 1.9 mm.  Nonobstructive calcified calculus in lower pole of the left kidney measures 2 mm.  Bilateral no proximal ureteral calculi are noted.  Retroflexed uterus is noted.  No small bowel obstruction.  No ascites or free air.  No adenopathy.  No pericecal inflammation.  In axial image 69 there is calcified obstructive calculus in distal right ureter measures 8.5 millimeters by 4.7 mm.  This is located about 5 mm from right UVJ.  Distal left ureter is unremarkable.  No calcified calculi are noted within urinary bladder.  IMPRESSION:  1.  There is moderate right hydronephrosis and right hydroureter. 2.  Bilateral nonobstructive nephrolithiasis. 3.  There is a 8.5 x 5 mm calcified obstructive calculus in distal right ureter about 5 mm from right UVJ. 4.  Stable 9 mm nodule in the right lower lobe. 5.  Stable cyst in caudate lobe of the liver.   Original Report Authenticated By: Natasha Mead, M.D.      1. Ureteral stone with hydronephrosis       MDM   Discussed findings with Dr Patsi Sears. Will admit and likely stent in AM.   Pt states she is feeling much better       Loren Racer, MD 07/13/12 1353

## 2012-07-14 ENCOUNTER — Encounter (HOSPITAL_COMMUNITY)
Admission: EM | Disposition: A | Discharge status: Home / Self Care | Payer: Self-pay | Source: Home / Self Care | Attending: Emergency Medicine

## 2012-07-14 ENCOUNTER — Encounter (HOSPITAL_COMMUNITY): Payer: Self-pay | Admitting: Anesthesiology

## 2012-07-14 ENCOUNTER — Observation Stay (HOSPITAL_COMMUNITY): Payer: Medicare Other | Admitting: Anesthesiology

## 2012-07-14 HISTORY — PX: CYSTOSCOPY W/ URETERAL STENT PLACEMENT: SHX1429

## 2012-07-14 HISTORY — PX: HOLMIUM LASER APPLICATION: SHX5852

## 2012-07-14 LAB — URINE CULTURE

## 2012-07-14 SURGERY — CYSTOSCOPY, WITH RETROGRADE PYELOGRAM AND URETERAL STENT INSERTION
Anesthesia: General | Site: Ureter | Laterality: Right | Wound class: Clean Contaminated

## 2012-07-14 MED ORDER — TRAMADOL-ACETAMINOPHEN 37.5-325 MG PO TABS: 1.0000 | ORAL_TABLET | Freq: Four times a day (QID) | ORAL | Status: DC | PRN

## 2012-07-14 MED ORDER — LACTATED RINGERS IV SOLN
INTRAVENOUS | Status: DC | PRN
  Administered 2012-07-14: 09:00:00 via INTRAVENOUS

## 2012-07-14 MED ORDER — METHENAMINE MAND-SOD PHOSPHATE 500-500 MG PO TABS: 1.0000 | ORAL_TABLET | ORAL | Status: DC

## 2012-07-14 MED ORDER — LACTATED RINGERS IV SOLN: INTRAVENOUS | Status: DC

## 2012-07-14 MED ORDER — FENTANYL CITRATE 0.05 MG/ML IJ SOLN
INTRAMUSCULAR | Status: DC | PRN
  Administered 2012-07-14: 100 ug via INTRAVENOUS
  Administered 2012-07-14 (×2): 25 ug via INTRAVENOUS

## 2012-07-14 MED ORDER — SODIUM CHLORIDE 0.9 % IR SOLN
Status: DC | PRN
  Administered 2012-07-14 (×2): 3000 mL via INTRAVESICAL

## 2012-07-14 MED ORDER — BELLADONNA ALKALOIDS-OPIUM 16.2-60 MG RE SUPP
RECTAL | Status: DC | PRN
  Administered 2012-07-14: 1 via RECTAL

## 2012-07-14 MED ORDER — URELLE 81 MG PO TABS: 1.0000 | ORAL_TABLET | Freq: Four times a day (QID) | ORAL | Status: DC | PRN

## 2012-07-14 MED ORDER — KETOROLAC TROMETHAMINE 30 MG/ML IJ SOLN
INTRAMUSCULAR | Status: DC | PRN
  Administered 2012-07-14: 30 mg via INTRAVENOUS

## 2012-07-14 MED ORDER — MIDAZOLAM HCL 5 MG/5ML IJ SOLN
INTRAMUSCULAR | Status: DC | PRN
  Administered 2012-07-14: 2 mg via INTRAVENOUS

## 2012-07-14 MED ORDER — PROPOFOL 10 MG/ML IV BOLUS
INTRAVENOUS | Status: DC | PRN
  Administered 2012-07-14: 120 mg via INTRAVENOUS

## 2012-07-14 MED ORDER — IOHEXOL 300 MG/ML  SOLN
INTRAMUSCULAR | Status: DC | PRN
  Administered 2012-07-14: 2 mL via INTRAVENOUS

## 2012-07-14 MED ORDER — DEXAMETHASONE SODIUM PHOSPHATE 10 MG/ML IJ SOLN
INTRAMUSCULAR | Status: DC | PRN
  Administered 2012-07-14: 10 mg via INTRAVENOUS

## 2012-07-14 MED ORDER — ONDANSETRON HCL 4 MG/2ML IJ SOLN
INTRAMUSCULAR | Status: DC | PRN
  Administered 2012-07-14: 4 mg via INTRAVENOUS

## 2012-07-14 SURGICAL SUPPLY — 26 items
ADAPTER CATH URET PLST 4-6FR (CATHETERS) ×2 IMPLANT
ADPR CATH URET STRL DISP 4-6FR (CATHETERS) ×1
BAG URO CATCHER STRL LF (DRAPE) ×2 IMPLANT
BASKET ZERO TIP NITINOL 2.4FR (BASKET) ×1 IMPLANT
BSKT STON RTRVL ZERO TP 2.4FR (BASKET) ×1
CATH CLEAR GEL 3F BACKSTOP (CATHETERS) ×1 IMPLANT
CATH INTERMIT  6FR 70CM (CATHETERS) ×2 IMPLANT
CATH URET 5FR 28IN OPEN ENDED (CATHETERS) ×2 IMPLANT
CLOTH BEACON ORANGE TIMEOUT ST (SAFETY) ×2 IMPLANT
DRAPE CAMERA CLOSED 9X96 (DRAPES) ×2 IMPLANT
GLOVE BIOGEL M STRL SZ7.5 (GLOVE) ×2 IMPLANT
GLOVE SURG SS PI 8.0 STRL IVOR (GLOVE) ×2 IMPLANT
GOWN PREVENTION PLUS XLARGE (GOWN DISPOSABLE) ×2 IMPLANT
GOWN STRL NON-REIN LRG LVL3 (GOWN DISPOSABLE) ×2 IMPLANT
GOWN STRL REIN XL XLG (GOWN DISPOSABLE) ×2 IMPLANT
GUIDEWIRE STR DUAL SENSOR (WIRE) ×2 IMPLANT
INJECTOR BACKSTOP (CATHETERS) ×1 IMPLANT
LASER FIBER DISP (UROLOGICAL SUPPLIES) ×1 IMPLANT
MANIFOLD NEPTUNE II (INSTRUMENTS) ×2 IMPLANT
MARKER SKIN DUAL TIP RULER LAB (MISCELLANEOUS) ×2 IMPLANT
NS IRRIG 1000ML POUR BTL (IV SOLUTION) ×2 IMPLANT
PACK CYSTO (CUSTOM PROCEDURE TRAY) ×2 IMPLANT
SCRUB PCMX 4 OZ (MISCELLANEOUS) ×2 IMPLANT
STENT CONTOUR 6FRX24X.038 (STENTS) ×1 IMPLANT
SYRINGE IRR TOOMEY STRL 70CC (SYRINGE) ×1 IMPLANT
TUBING CONNECTING 10 (TUBING) ×2 IMPLANT

## 2012-07-14 NOTE — Anesthesia Postprocedure Evaluation (Signed)
  Anesthesia Post-op Note  Patient: Mary [name]  Procedure(s) Performed: Procedure(s) (LRB): CYSTOSCOPY WITH RETROGRADE PYELOGRAM/URETERAL STENT PLACEMENT (Right) HOLMIUM LASER APPLICATION (Right)  Patient Location: PACU  Anesthesia Type: General  Level of Consciousness: awake and alert   Airway and Oxygen Therapy: Patient Spontanous Breathing  Post-op Pain: mild  Post-op Assessment: Post-op Vital signs reviewed, Patient's Cardiovascular Status Stable, Respiratory Function Stable, Patent Airway and No signs of Nausea or vomiting  Post-op Vital Signs: stable  Complications: No apparent anesthesia complications

## 2012-07-14 NOTE — Op Note (Signed)
Pre-operative diagnosis : Right lower ureteral calculus, impacted with right hydronephrosis, and renal colic  Postoperative diagnosis: Same  Operation: Cystourethroscopy, right retrograde Pyelogram with interpretation, right ureteroscopy with insertion of BACKSTOP, laser fragmentation of right ureterovesical junction calculus (8 mm), with basket extraction of fragments, insertion of right double-J stent 6 French by 24 cm).  Surgeon:  Kathie Rhodes. Patsi Sears, MD  First assistant: None   Anesthesia:  general  Preparation: After appropriate preanesthesia, the patient was brought to the operative room, placed on the operating table in the dorsal supine position where general LMA anesthesia was introduced. She was replaced in the dorsal lithotomy position where the pubis was prepped with Betadine solution and draped in usual fashion. The right arm was previously marked. The arm band was double checked.   Review history:HPI  Pt with R flank pain since Wednsday, urinary hesitancy and frequency since Thursday. No fever chills, gross hematuria. Pt states flank pain was worse Friday night , with +nasea and vomiting. Pain is similar to previous renal stones. ED evaluation shows lower ureteral stone , hydronephrosis. She is admitted pro-op for antibiotics, hydration, and stone removal in AM.  Past Medical History   Diagnosis  Date   .  History of sinusitis    .  Bronchitis    .  Hypertension    .  Venous insufficiency    .  Hypercholesteremia    .  GERD (gastroesophageal reflux disease)    .  Diverticulosis of colon    .  History of cyst of breast    .  DJD (degenerative joint disease)    .  DDD (degenerative disc disease)    .  Carpal tunnel syndrome      bilateral   .  Osteoporosis    .  Anxiety    .  Kidney stones        Statement of  Likelihood of Success: Excellent. TIME-OUT observed.:  Procedure: Cystourethroscopy was accomplished, which showed normal urethra, and postmenopausal BUS. The  bladder base appeared normal, with normal trigone. The right ureteral orifice was identified, and right retrograde pyelogram showed an 8 mm stone in the intramural portion of the right ureter. A sensor wire, 0.038 was then passed around the stone into the kidney under fluoroscopic control. Following this, the cystoscope was removed, and the safety wire left in place. The short 6 French ureteroscope was then passed into the right ureteral orifice, and the stone identified and photo documented.  The BACKSTOP catheter was then placed, and the gel inserted. Following this, a 365  laser fiber was placed, with the settings of 5/20, and the stone fragmented easily. Stone fragments were then basket extracted. Repeat ureteroscopy was accomplished, until all fragments removed. A 6 French by 24 cm double-J stent was then placed with the suture removed. The patient had previously received IV Tylenol, and a B. and O. suppository. She now received IV Toradol. She was awakened and taken to recovery room in good condition.

## 2012-07-14 NOTE — Discharge Summary (Signed)
Physician Discharge Summary  Patient ID: Mary [name] MRN: [id number] DOB/AGE: [birth year] 70 y.o.  Admit date: 07/13/2012 Discharge date: 07/14/2012  Admission Diagnoses: Ureteral stone with hydronephrosis [592.1] POSSIBLE KIDNEY STONES right ureteral stone  Discharge Diagnoses: SAME Active Problems:  * No active hospital problems. *    Discharged Condition: resolved  Hospital Course:  Surgery 11/ 10/13. Cysto, Right ureteroscopy, laser stone, JJ stent  Consults: None  Significant Diagnostic Studies: Ct Abdomen Pelvis Wo Contrast  07/13/2012  *RADIOLOGY REPORT*  Clinical Data: Right flank pain  CT ABDOMEN AND PELVIS WITHOUT CONTRAST  Technique:  Multidetector CT imaging of the abdomen and pelvis was performed following the standard protocol without intravenous contrast.  Comparison: CT scan 01/27/2011  Findings: Sagittal images of the spine shows disc space flattening with endplate sclerotic changes and mild anterior spurring at L4-L5 level.  Lung bases shows stable 9 mm nodule in the right lower lobe.  Unenhanced liver shows no biliary ductal dilatation.  A cyst in caudate lobe of the liver measures 3.4 cm stable from prior exam.  No calcified gallstones are noted within gallbladder.  Pancreas, spleen and adrenal glands are unremarkable.  There is moderate right hydronephrosis and right hydroureter.  Nonobstructive calcified calculus in the lower pole of the right kidney measures 1.9 mm.  Nonobstructive calcified calculus in lower pole of the left kidney measures 2 mm.  Bilateral no proximal ureteral calculi are noted.  Retroflexed uterus is noted.  No small bowel obstruction.  No ascites or free air.  No adenopathy.  No pericecal inflammation.  In axial image 69 there is calcified obstructive calculus in distal right ureter measures 8.5 millimeters by 4.7 mm.  This is located about 5 mm from right UVJ.  Distal left ureter is unremarkable.  No calcified calculi are noted within urinary  bladder.  IMPRESSION:  1.  There is moderate right hydronephrosis and right hydroureter. 2.  Bilateral nonobstructive nephrolithiasis. 3.  There is a 8.5 x 5 mm calcified obstructive calculus in distal right ureter about 5 mm from right UVJ. 4.  Stable 9 mm nodule in the right lower lobe. 5.  Stable cyst in caudate lobe of the liver.   Original Report Authenticated By: Natasha Mead, M.D.     Treatments: \IV fluid. P[ain med, nausea med, Surgery  Discharge Exam: Blood pressure 137/60, pulse 75, temperature 98.3 F (36.8 C), temperature source Oral, resp. rate 12, height 5' (1.524 m), weight 67.631 kg (149 lb 1.6 oz), SpO2 100.00%. General appearance: alert, cooperative and appears stated age  Disposition: 01-Home or Self Care  Discharge Orders    Future Appointments: Provider: Department: Dept Phone: Center:   10/02/2012 10:30 AM Michele Mcalpine, MD [facility] [phone] None     Future Orders Please Complete By Expires   Diet - low sodium heart healthy      Increase activity slowly      Discharge instructions      Comments:   DISCHARGE INSTRUCTIONS FOR KIDNEY STONES OR URETERAL STENT  MEDICATIONS:   1. DO NOT RESUME YOUR ASPIRIN, or any other medicines like ibuprofen, motrin, excedrin, advil, aleve, vitamin E, fish oil as these can all cause bleeding x 7 days.  2. Resume all your other meds from home - except do not take any other pain meds that you may have at home.  ACTIVITY 1. No strenuous activity x 1week 2. No driving while on narcotic pain medications 3. Drink plenty of water 4. Continue to walk  at home - you can still get blood clots when you are at home, so keep active, but don't over do it. 5. May return to work in 3 days.  BATHING 1. You can shower and we recommend daily showers  2. If you have a string coming from your urethra:  The stent string is attached to your ureteral stent.  Do not pull on this.   SIGNS/SYMPTOMS TO CALL: 1. Please call us if you  have a fever greater than 101.5, uncontrolled  nausea/vomiting, uncontrolled pain, dizziness, unable to urinate, bloody urine, chest pain, shortness of breath, leg swelling, leg pain, redness around wound, drainage from wound, or any other concerns or questions.  You can reach [place] at [phone].  FOLLOW-UP Call [phone] for follow-up appointment.  .   Discontinue IV      Discharge patient          Medication List     As of 07/14/2012 10:21 AM    TAKE these medications         aspirin 81 MG tablet   Take 81 mg by mouth daily.      AZO DINE 95 MG tablet   Generic drug: phenazopyridine   Take 95 mg by mouth 3 (three) times daily as needed. Bladder irritation      bisoprolol-hydrochlorothiazide 5-6.25 MG per tablet   Commonly known as: ZIAC   Take 1 tablet by mouth daily.      CALTRATE 600+D PLUS 600-400 MG-UNIT per tablet   Take 1 tablet by mouth daily.      cholecalciferol 1000 UNITS tablet   Commonly known as: VITAMIN D   Take 1,000 Units by mouth daily.      lisinopril 20 MG tablet   Commonly known as: PRINIVIL,ZESTRIL   Take 20 mg by mouth daily.      methenamine-sodium biphosphonate 500-500 MG per tablet   Commonly known as: UROQUID   Take 1 tablet by mouth 1 day or 1 dose.      simvastatin 20 MG tablet   Commonly known as: ZOCOR   Take 20 mg by mouth every evening.      traMADol-acetaminophen 37.5-325 MG per tablet   Commonly known as: ULTRACET   Take 1 tablet by mouth every 6 (six) hours as needed for pain.      URELLE 81 MG Tabs   Take 1 tablet (81 mg total) by mouth every 6 (six) hours as needed.      vitamin C 500 MG tablet   Commonly known as: ASCORBIC ACID   Take 500 mg by mouth daily.      Womens Multivitamin Plus Tabs   Take 1 tablet by mouth daily.           Follow-up Information    Follow up with Jethro Bolus I, MD. Call in 1 day. (make appointment for JJ removal for 1-2 weeks)    Contact information:   [address],  [address] [place] [address] [phone]          Signed: Jethro Bolus I 07/14/2012, 10:21 AM

## 2012-07-14 NOTE — Transfer of Care (Signed)
Immediate Anesthesia Transfer of Care Note  Patient: Mary [name]  Procedure(s) Performed: Procedure(s) (LRB) with comments: CYSTOSCOPY WITH RETROGRADE PYELOGRAM/URETERAL STENT PLACEMENT (Right) HOLMIUM LASER APPLICATION (Right)  Patient Location: PACU  Anesthesia Type:General  Level of Consciousness: awake, alert  and patient cooperative  Airway & Oxygen Therapy: Patient Spontanous Breathing and Patient connected to face mask oxygen  Post-op Assessment: Report given to PACU RN and Post -op Vital signs reviewed and stable  Post vital signs: Reviewed and stable  Complications: No apparent anesthesia complications

## 2012-07-14 NOTE — Anesthesia Preprocedure Evaluation (Signed)
Anesthesia Evaluation  Patient identified by MRN, date of birth, ID band Patient awake    Reviewed: Allergy & Precautions, H&P , NPO status , Patient's Chart, lab work & pertinent test results  Airway Mallampati: II TM Distance: <3 FB Neck ROM: Full    Dental No notable dental hx.    Pulmonary neg pulmonary ROS,  breath sounds clear to auscultation  Pulmonary exam normal       Cardiovascular hypertension, Pt. on medications Rhythm:Regular Rate:Normal     Neuro/Psych negative neurological ROS  negative psych ROS   GI/Hepatic negative GI ROS, Neg liver ROS,   Endo/Other  negative endocrine ROS  Renal/GU negative Renal ROS  negative genitourinary   Musculoskeletal negative musculoskeletal ROS (+)   Abdominal   Peds negative pediatric ROS (+)  Hematology negative hematology ROS (+)   Anesthesia Other Findings   Reproductive/Obstetrics negative OB ROS                           Anesthesia Physical Anesthesia Plan  ASA: II and emergent  Anesthesia Plan: General   Post-op Pain Management:    Induction: Intravenous  Airway Management Planned: LMA  Additional Equipment:   Intra-op Plan:   Post-operative Plan:   Informed Consent: I have reviewed the patients History and Physical, chart, labs and discussed the procedure including the risks, benefits and alternatives for the proposed anesthesia with the patient or authorized representative who has indicated his/her understanding and acceptance.   Dental advisory given  Plan Discussed with: CRNA and Surgeon  Anesthesia Plan Comments:         Anesthesia Quick Evaluation

## 2012-07-14 NOTE — Interval H&P Note (Signed)
History and Physical Interval Note:  07/14/2012 9:13 AM  [name]  has presented today for surgery, with the diagnosis of right ureteral stone  The various methods of treatment have been discussed with the patient and family. After consideration of risks, benefits and other options for treatment, the patient has consented to  Procedure(s) (LRB) with comments: CYSTOSCOPY WITH RETROGRADE PYELOGRAM/URETERAL STENT PLACEMENT (Right) HOLMIUM LASER APPLICATION (Right) as a surgical intervention .  The patient's history has been reviewed, patient examined, no change in status, stable for surgery.  I have reviewed the patient's chart and labs.  Questions were answered to the patient's satisfaction.     Mary [name] I

## 2012-07-14 NOTE — Preoperative (Signed)
Beta Blockers   Reason not to administer Beta Blockers:Not Applicable 

## 2012-07-15 ENCOUNTER — Encounter (HOSPITAL_COMMUNITY): Payer: Self-pay | Admitting: Urology

## 2012-07-17 ENCOUNTER — Emergency Department (HOSPITAL_COMMUNITY)
Admission: EM | Admit: 2012-07-17 | Discharge: 2012-07-18 | Disposition: A | Discharge status: Home / Self Care | Payer: Medicare Other | Attending: Emergency Medicine | Admitting: Emergency Medicine

## 2012-07-17 ENCOUNTER — Emergency Department (HOSPITAL_COMMUNITY): Payer: Medicare Other

## 2012-07-17 ENCOUNTER — Encounter (HOSPITAL_COMMUNITY): Payer: Self-pay | Admitting: *Deleted

## 2012-07-17 DIAGNOSIS — Z8659 Personal history of other mental and behavioral disorders: Secondary | ICD-10-CM | POA: Insufficient documentation

## 2012-07-17 DIAGNOSIS — J40 Bronchitis, not specified as acute or chronic: Secondary | ICD-10-CM | POA: Insufficient documentation

## 2012-07-17 DIAGNOSIS — M810 Age-related osteoporosis without current pathological fracture: Secondary | ICD-10-CM | POA: Insufficient documentation

## 2012-07-17 DIAGNOSIS — Z79899 Other long term (current) drug therapy: Secondary | ICD-10-CM | POA: Insufficient documentation

## 2012-07-17 DIAGNOSIS — Z8669 Personal history of other diseases of the nervous system and sense organs: Secondary | ICD-10-CM | POA: Insufficient documentation

## 2012-07-17 DIAGNOSIS — Z8742 Personal history of other diseases of the female genital tract: Secondary | ICD-10-CM | POA: Insufficient documentation

## 2012-07-17 DIAGNOSIS — Z8719 Personal history of other diseases of the digestive system: Secondary | ICD-10-CM | POA: Insufficient documentation

## 2012-07-17 DIAGNOSIS — I10 Essential (primary) hypertension: Secondary | ICD-10-CM | POA: Insufficient documentation

## 2012-07-17 DIAGNOSIS — IMO0002 Reserved for concepts with insufficient information to code with codable children: Secondary | ICD-10-CM | POA: Insufficient documentation

## 2012-07-17 DIAGNOSIS — Z8709 Personal history of other diseases of the respiratory system: Secondary | ICD-10-CM | POA: Insufficient documentation

## 2012-07-17 DIAGNOSIS — N200 Calculus of kidney: Secondary | ICD-10-CM

## 2012-07-17 DIAGNOSIS — Z7982 Long term (current) use of aspirin: Secondary | ICD-10-CM | POA: Insufficient documentation

## 2012-07-17 DIAGNOSIS — E7800 Pure hypercholesterolemia, unspecified: Secondary | ICD-10-CM | POA: Insufficient documentation

## 2012-07-17 LAB — URINALYSIS, ROUTINE W REFLEX MICROSCOPIC
Bilirubin Urine: NEGATIVE
Glucose, UA: NEGATIVE mg/dL
Ketones, ur: NEGATIVE mg/dL
Specific Gravity, Urine: 1.014 (ref 1.005–1.030)
pH: 7.5 (ref 5.0–8.0)

## 2012-07-17 LAB — URINE MICROSCOPIC-ADD ON

## 2012-07-17 MED ORDER — HYDROMORPHONE HCL PF 1 MG/ML IJ SOLN
2.0000 mg | Freq: Once | INTRAMUSCULAR | Status: AC
  Administered 2012-07-17: 2 mg via INTRAMUSCULAR
  Filled 2012-07-17: qty 2

## 2012-07-17 MED ORDER — ONDANSETRON 8 MG PO TBDP
8.0000 mg | ORAL_TABLET | Freq: Once | ORAL | Status: AC
  Administered 2012-07-17: 8 mg via ORAL
  Filled 2012-07-17: qty 1

## 2012-07-17 NOTE — ED Notes (Signed)
Pt reports acute onset of rt flank pain that began tonight - pt underwent lithotripsy on Sunday for nephrolithiasis and felt better afterwards. Pt grimacing and restless in triage.

## 2012-07-17 NOTE — ED Provider Notes (Signed)
History     CSN: [id number]  Arrival date & time 07/17/12  2136   First MD Initiated Contact with Mary [name] 07/17/12 2147      Chief Complaint  Mary [name] presents with  . Flank Pain    (Consider location/radiation/quality/duration/timing/severity/associated sxs/prior treatment) Mary [name] is a 71 y.o. female presenting with flank pain. The history is provided by the Mary [name].  Flank Pain   Mary [name] here with sudden onset of right-sided flank pain just prior to arrival. History of lithotripsy 3 days ago for similar symptoms. No hematuria or fever. Pain is been colicky and similar to her prior kidney stones. No vomiting or diarrhea. Use of medications for pain at home without relief. Scheduled have her stent removed tomorrow  Past Medical History  Diagnosis Date  . History of sinusitis   . Bronchitis   . Hypertension   . Venous insufficiency   . Hypercholesteremia   . GERD (gastroesophageal reflux disease)   . Diverticulosis of colon   . History of cyst of breast   . DJD (degenerative joint disease)   . DDD (degenerative disc disease)   . Carpal tunnel syndrome     bilateral  . Osteoporosis   . Anxiety   . Kidney stones     Past Surgical History  Procedure Date  . Right rotator cuff surgery 04/2005    Dr. Leslee Home  . Laminectomy and microdiscectomy lumbar spine 02/2002    Dr. Leslee Home  . Holmium laser lithotripsy for right ureteral stone 6/12    by DrGrapey  . Retropubic suburethral sling for urinary incontinence 6/12     by DrGrapey  . Cystoscopy w/ ureteral stent placement 07/14/2012    Procedure: CYSTOSCOPY WITH RETROGRADE PYELOGRAM/URETERAL STENT PLACEMENT;  Surgeon: Kathi Ludwig, MD;  Location: [facility];  Service: Urology;  Laterality: Right;  . Holmium laser application 07/14/2012    Procedure: HOLMIUM LASER APPLICATION;  Surgeon: Kathi Ludwig, MD;  Location: [facility];  Service: Urology;  Laterality: Right;    History reviewed. No pertinent family  history.  History  Substance Use Topics  . Smoking status: Never Smoker   . Smokeless tobacco: Never Used  . Alcohol Use: No    OB History    Grav Para Term Preterm Abortions TAB SAB Ect Mult Living                  Review of Systems  Genitourinary: Positive for flank pain.  All other systems reviewed and are negative.    Allergies  Sulfonamide derivatives  Home Medications   Current Outpatient Rx  Name  Route  Sig  Dispense  Refill  . ASPIRIN 81 MG PO TABS   Oral   Take 81 mg by mouth daily.           Marland Kitchen BISOPROLOL-HYDROCHLOROTHIAZIDE 5-6.25 MG PO TABS   Oral   Take 1 tablet by mouth daily.         Marland Kitchen CALTRATE 600+D PLUS 600-400 MG-UNIT PO TABS   Oral   Take 1 tablet by mouth daily.           Marland Kitchen VITAMIN D 1000 UNITS PO TABS   Oral   Take 1,000 Units by mouth daily.           Marland Kitchen LISINOPRIL 20 MG PO TABS   Oral   Take 20 mg by mouth daily.         Marland Kitchen METHENAMINE MAND-SOD PHOSPHATE 500-500 MG PO TABS   Oral   Take  1 tablet by mouth 1 day or 1 dose.   14 tablet   1   . WOMENS MULTIVITAMIN PLUS PO TABS   Oral   Take 1 tablet by mouth daily.           Marland Kitchen PHENAZOPYRIDINE HCL 95 MG PO TABS   Oral   Take 95 mg by mouth 3 (three) times daily as needed. Bladder irritation         . SIMVASTATIN 20 MG PO TABS   Oral   Take 20 mg by mouth every evening.         Marland Kitchen TRAMADOL-ACETAMINOPHEN 37.5-325 MG PO TABS   Oral   Take 1 tablet by mouth every 6 (six) hours as needed for pain.   30 tablet   2   . URELLE 81 MG PO TABS   Oral   Take 1 tablet by mouth every 6 (six) hours as needed. Urinary tract discomfort.         Marland Kitchen VITAMIN C 500 MG PO TABS   Oral   Take 500 mg by mouth daily.             BP 198/93  Pulse 75  Temp 97.6 F (36.4 C) (Oral)  Resp 24  SpO2 100%  Physical Exam  Nursing note and vitals reviewed. Constitutional: She is oriented to person, place, and time. She appears well-developed and well-nourished.  Non-toxic  appearance. No distress.  HENT:  Head: Normocephalic and atraumatic.  Eyes: Conjunctivae normal, EOM and lids are normal. Pupils are equal, round, and reactive to light.  Neck: Normal range of motion. Neck supple. No tracheal deviation present. No mass present.  Cardiovascular: Normal rate, regular rhythm and normal heart sounds.  Exam reveals no gallop.   No murmur heard. Pulmonary/Chest: Effort normal and breath sounds normal. No stridor. No respiratory distress. She has no decreased breath sounds. She has no wheezes. She has no rhonchi. She has no rales.  Abdominal: Soft. Normal appearance and bowel sounds are normal. She exhibits no distension. There is no tenderness. There is no rebound and no CVA tenderness.  Musculoskeletal: Normal range of motion. She exhibits no edema and no tenderness.  Neurological: She is alert and oriented to person, place, and time. She has normal strength. No cranial nerve deficit or sensory deficit. GCS eye subscore is 4. GCS verbal subscore is 5. GCS motor subscore is 6.  Skin: Skin is warm and dry. No abrasion and no rash noted.  Psychiatric: She has a normal mood and affect. Her speech is normal and behavior is normal.    ED Course  Procedures (including critical care time)   Labs Reviewed  URINALYSIS, ROUTINE W REFLEX MICROSCOPIC   No results found.   No diagnosis found.    MDM  Pt given pain meds and feels better--has scheduled appointment with urology tomorrow        Toy Baker, MD 07/17/12 [phone]

## 2012-10-02 ENCOUNTER — Ambulatory Visit (INDEPENDENT_AMBULATORY_CARE_PROVIDER_SITE_OTHER): Payer: Medicare Other | Admitting: Pulmonary Disease

## 2012-10-02 ENCOUNTER — Other Ambulatory Visit (INDEPENDENT_AMBULATORY_CARE_PROVIDER_SITE_OTHER): Payer: Medicare Other

## 2012-10-02 ENCOUNTER — Encounter: Payer: Self-pay | Admitting: Pulmonary Disease

## 2012-10-02 VITALS — BP 150/82 | HR 71 | Temp 98.2°F | Ht 60.0 in | Wt 142.8 lb

## 2012-10-02 DIAGNOSIS — K5730 Diverticulosis of large intestine without perforation or abscess without bleeding: Secondary | ICD-10-CM

## 2012-10-02 DIAGNOSIS — IMO0002 Reserved for concepts with insufficient information to code with codable children: Secondary | ICD-10-CM

## 2012-10-02 DIAGNOSIS — M1990 Unspecified osteoarthritis, unspecified site: Secondary | ICD-10-CM

## 2012-10-02 DIAGNOSIS — F411 Generalized anxiety disorder: Secondary | ICD-10-CM

## 2012-10-02 DIAGNOSIS — E7800 Pure hypercholesterolemia, unspecified: Secondary | ICD-10-CM

## 2012-10-02 DIAGNOSIS — I10 Essential (primary) hypertension: Secondary | ICD-10-CM

## 2012-10-02 DIAGNOSIS — R911 Solitary pulmonary nodule: Secondary | ICD-10-CM

## 2012-10-02 DIAGNOSIS — K219 Gastro-esophageal reflux disease without esophagitis: Secondary | ICD-10-CM

## 2012-10-02 DIAGNOSIS — I872 Venous insufficiency (chronic) (peripheral): Secondary | ICD-10-CM

## 2012-10-02 DIAGNOSIS — M810 Age-related osteoporosis without current pathological fracture: Secondary | ICD-10-CM

## 2012-10-02 DIAGNOSIS — N200 Calculus of kidney: Secondary | ICD-10-CM

## 2012-10-02 LAB — CBC WITH DIFFERENTIAL/PLATELET
Basophils Absolute: 0 10*3/uL (ref 0.0–0.1)
Basophils Relative: 0.3 % (ref 0.0–3.0)
Eosinophils Absolute: 0.1 10*3/uL (ref 0.0–0.7)
Lymphocytes Relative: 25.7 % (ref 12.0–46.0)
MCHC: 33.7 g/dL (ref 30.0–36.0)
MCV: 92 fl (ref 78.0–100.0)
Monocytes Absolute: 1 10*3/uL (ref 0.1–1.0)
Neutro Abs: 6.2 10*3/uL (ref 1.4–7.7)
Neutrophils Relative %: 62.9 % (ref 43.0–77.0)
RBC: 4.71 Mil/uL (ref 3.87–5.11)
RDW: 13.2 % (ref 11.5–14.6)

## 2012-10-02 LAB — BASIC METABOLIC PANEL
CO2: 29 mEq/L (ref 19–32)
Chloride: 103 mEq/L (ref 96–112)
Creatinine, Ser: 0.9 mg/dL (ref 0.4–1.2)
Potassium: 4 mEq/L (ref 3.5–5.1)
Sodium: 139 mEq/L (ref 135–145)

## 2012-10-02 LAB — HEPATIC FUNCTION PANEL
ALT: 24 U/L (ref 0–35)
Alkaline Phosphatase: 89 U/L (ref 39–117)
Bilirubin, Direct: 0 mg/dL (ref 0.0–0.3)
Total Bilirubin: 0.5 mg/dL (ref 0.3–1.2)
Total Protein: 7.4 g/dL (ref 6.0–8.3)

## 2012-10-02 MED ORDER — LOSARTAN POTASSIUM 100 MG PO TABS: 100.0000 mg | ORAL_TABLET | Freq: Every day | ORAL | Status: DC

## 2012-10-02 MED ORDER — BISOPROLOL-HYDROCHLOROTHIAZIDE 5-6.25 MG PO TABS: 1.0000 | ORAL_TABLET | Freq: Every day | ORAL | Status: DC

## 2012-10-02 NOTE — Patient Instructions (Addendum)
Today we updated your med list in our EPIC system...     We decided to change the Lisinopril (which caused episodes in your sister & sounds in your ears) to COZAAR (Losartan) 100mg - one tab daily...  We also decided to stop the SIMVASTATIN since you are concerned about your family hx of liver disease...    You will need to improve your low chol/ low fat diet efforts if you hope to control your lipids on diet alone...  Today we did your non-fasting blood work... We will arrange for a follow up bone density test...    We will contact you w/ these results when avail...  Call for any questions...  Let's plan a follow up visit in 6 months at which time we will check your Lipid profile & f/u CT Chest..Marland Kitchen

## 2012-10-02 NOTE — Progress Notes (Signed)
Subjective:    Patient ID: Mary [name], female    DOB: 1941-04-20, 72 y.o.   MRN: [id number]  HPI 72 y/o WF here for a follow up visit, she is Mary [name]'s sister... she has HBP, Hypercholesterolemia, & Osteopenia- on meds below...   ~  March 02, 2011:  9mo ROV> BP remains under good control w/ Ziac & Lisinopril;  Chol continues to look good on Simva20;  Ortho stable & her Alendronate is on hold for drug holiday...    <Kidney Stone>  she developed sudden right flank pain 01/27/11 w/ N & V, went to the ER where she was diagnosed w/ right ureteral stone; treated w/ pain meds, fluids, & Keflex for infection; she was already sched for incontinence surg by DrGrapey- so on 02/13/11 he did a cysto, retrograde pyelogram, holmium laser lithotripsy w/ basketing of the fragments, double J stent, AND retropubic suburethral sling;  She reports great result- stone ablated & incont resolved...     <72mm Right base pulm nodule>  during the ER eval for the kidney stone she had a CT Abd that showed an incidental 9mm nodule right lung base (?RML); she is a never smoker, no hx lung dis or pulm problems other than the occas bout of bronchitis; last CXR was 1/10= clear, NAD; she denies cough, sputum, hemoptysis, SOB, etc; she exercises by walking (no DOE) & cares for her blind 2y/o grandson;  We discussed further eval w/ CT Chest ==> confirms 8mm RML nodule, 4mm LLL nodule, no adenopathy, heterogeneous thyroid, DJD spine...  We discussed options and decided on watchful waiting w/ CXR in 22mo & repeat CT Chest in 6-222mo...  ~  October 06, 2011:  22mo ROV & she reports a good interval but had recent bronchitic exac & [facility] (given 2 antibiotics & cough syrup- didn't bring bottles); now improved> f/u CXR today shows clear lungs, the 8mm RML nodule seen on CXR cannot be seen on the current film & we discussed f/u in 22mo w/ another CT at that time...  She is due for Fasting blood work today> see below; she had 2012  Flu vaccine 9/12, & requests refill prescriptions for 90d supplies...  ~  April 03, 2012:  22mo ROV & [name] is feeling well- no new complaints or concerns; specifically she denies cough, sput, hemoptysis, SOB, chest discomfort, etc... Incidental 8-84mm right base nodule seen on CTAbd 5/12 as part of w/u for kidney stone; CTChest confirmed & showed 4mm LLL nodule as well; she is low risk, non-smoker & interval CXR was clear- due for 5mo f/u CT at this time...    We reviewed prob list, meds, xrays and labs> see below for updates >> BMet 7/13 is wnl... CT Chest 8/13 showed ==> right lung nodule measures 8mm, no new nodules or adenopathy, bilat thyroid nodules noted, simple cyst in liver...   ~  October 02, 2012:  22mo ROV & Mary [name] had another kidney stone 11/13, this one on the right w/ hydroneph, & required cysto, laser rx (DrTannenbaum), stent, removed, now improved... We reviewed the following medical problems during today's office visit >>     Pulm nodule> 9mm RLL nodule seen on CTAbd done for eval renal stone 5/12; never smoked, no hx lung dis or pulm problems; CXR is clear & she is asymptomatic; f/u CTChest 8/13 showed 8mm nodule, no new lesions or adenopathy...    HBP> on ASA81, Ziac5, Lisin20; BP= 150/82 & she describes prob that her sister  had w/ Lisinopril; we decided to change this to LOSARTAN 100mg /d & follow BP.    Ven insuffic> on low sodium diet, elevation, support hose...    Chol> on Simva20; she is very specific that she wants to stop Statin Rx & control as best she can w/ diet alone; Mother died from liver cancer 7 several family members w/ liver dis...     Borderline BS> she is on low carb diet & wt stable ~143#...    GI- GERD, Divertics> on OTC Prilosec as needed; she is asymptomatic w/o abd pain, n/v, c/d, blood seen; last colon was 2011 & neg...    Kidney stones> off all urology meds now; she had another right kid stone 11/13 req cysto, laser rx, stent, etc- now resolved...    DJD,  DDD, CTS> off prev Ultracet rx; she is c/o left shoulder pain w/ decr ROM & encouraged to f/u w/ ortho, take OTC Ibuprofen prn...    Osteopenia> on Calcium, MVI, VitD1000; BMD 2/14 shows TScores +0.4 in spine & -1.8 in right FemNeck; VitD=63; Rec- continue same & off Bisphos for now.    Anxiety> she has not required anxiolytic meds... We reviewed prob list, meds, xrays and labs> see below for updates >> she had the flu shot 10/13; requests refills all meds for 90d... LABS 1/14:  Chems- wnl;  CBC- wnl;  TSH=0.67;  VitD=63           Problem List:  Hx of SINUSITIS (ICD-473.9)                                                  Hx of BRONCHITIS (ICD-490) >> ~  1/13:  She reports recent bronchitic exac & [facility]> treated w/ 2 antibiotics ?which ones, and cough syrup; symptoms resolved & back to baseline...  PULMONARY NODULE >>  ~  6/12:  Discovered during the 5/12 ER eval for kidney stone; she had CT Abd that showed an incidental 9mm nodule right lung base (?RML); she is a never smoker, no hx lung dis or pulm problems other than the occas bout of bronchitis; last CXR was 1/10= clear, NAD; she denies cough, sputum, hemoptysis, SOB, etc; she exercises by walking (no DOE) & cares for her blind 2 y/o grandson;  We discussed further eval w/ CT Chest. ~  7/12:  CT Chest w/ contrast confirms 8mm RML nodule, 4mm LLL nodule, no adenopathy, heterogeneous thyroid, DJD spine... ~  2/13:  CXR is clear, no infiltrates, nodules, adenopathy, etc... ~  CT Chest 8/13 showed ==> right lung nodule measures 8mm, no new nodules or adenopathy, bilat thyroid nodules noted, simple cyst in liver...   HYPERTENSION (ICD-401.9) - on ASA 81mg /d,  LISINOPRIL 20mg /d, & ZIAC 5mg /d...  ~  6/12:  BP today= 144/76, takes med regularly, tolerates well> denies HA, fatigue, visual changes, CP, palpit, dizziness, syncope, dyspnea, edema, etc... ~  2/13:  BP= 140/80 & she remains asymptomatic... ~  7/13:  BP= 142/70 & she denies  CP, palpit, SOB, edema, etc... ~  11/13:  EKG showed NSR, rate84, min NSSTTWA, NAD... ~  1/14: on ASA81, Ziac5, Lisin20; BP= 150/82 & she describes prob that her sister had w/ Lisinopril; we decided to change this to LOSARTAN 100mg /d & follow BP.  VENOUS INSUFFICIENCY (ICD-459.81) >> she knows to elim sodium, elev legs, &  wear support hose...  HYPERCHOLESTEROLEMIA (ICD-272.0) - on SIMVASTATIN 20mg /d & tol well. ~  FLP 2/08 on Lip10 showed TChol 188, TG 176, HDL 63, LDL 90 ~  FLP 1/10 on Simva20 showed TChol 188, TG 108, HDL 59, LDL 107 ~  FLP 2/11 on Simva20 showed TChol 168, TG 181, HDL 53, LDL 79 ~  FLP 2/12 on Simva20 showed TChol 164, TG 120, HDL 46, LDL 94 ~  FLP 2/13 on Simva20 showed TChol 191, TG 177, HDL 52, LDL 103  THYROID NODULES >>  ~  CT Chest 7/12 & 8/13 showed incidental bilat thyroid nodules (no changes serially)...  GERD (ICD-530.81) - she uses OTC Prilosec as needed... denies nausea, vomiting, heartburn, diarrhea, constipation, blood in stool, abdominal pain, swelling, gas...  DIVERTICULOSIS OF COLON (ICD-562.10) - she is essent asymptomatic... ~  colonoscopy 12/00 by DrStark was neg> f/u planned 71yrs. ~  colonoscopy 3/11 by DrStark showed mild divertics, otherw neg.  Hx of BREAST CYST (ICD-610.0) - benign breast cyst asp 1989, she gets yearly mammogram [facility].  KIDNEY STONES>>  ~  6/12:  she developed sudden right flank pain 01/27/11 w/ N & V, went to the ER where she was diagnosed w/ right ureteral stone; treated w/ pain meds, fluids, & Keflex for infection; she was already sched for incontinence surg by DrGrapey- so on 02/13/11 he did a cysto, retrograde pyelogram, holmium laser lithotripsy w/ basketing of the fragments, double J stent, AND retropubic suburethral sling;  She reports great result- stone ablated & incont resolved...  ~  7/13:  Pt reports that DrGrapey saw 2 sm stones in kidney but currently asymptomatic... ~  11/13: Bey had another kidney  stone 11/13, this one on the right w/ hydroneph, & required lithotripsy, stent, removed, now improved; CTAbd showed bilat non-obstructive stones in kidneys... ~  12/13: f/u by Urology s/p stent removal & Sonar showed the hydroneph has resolved; given stone prevention guidelines...  DEGENERATIVE JOINT DISEASE (ICD-715.90) - she notes left shoulder pain w/ decr ROM & asked to f/u w/ Ortho. Hx of DEGENERATIVE DISC DISEASE (ICD-722.6) - s/p lumbar laminectomy, no signif LBP now & walks some... she has DJD but not significantly limited and uses OTC meds Prn. CARPAL TUNNEL SYNDROME, BILATERAL (ICD-354.0) - she has seen DrAplington in the past; uses wrist splints qHS- had right carpel tunnel release, & right rotator cuff surg...  OSTEOPOROSIS (ICD-733.00) - on Calcium, Vits, & Vit D; prev on Alendronate x yrs & placed on hold 2/12 for drug holiday... ~  BMD 12/07 showed TScores -0.8 to -1.9 ( sl better spine, sl worse hips)... rec- Alendronate 70mg /wk. ~  labs 2/08 w/ Vit D level = 34... rec to take Vit D 1000 u OTC daily... ~  BMD 1/10 on Alendronate showed TScores +0.2 in Spine, & -1.9 in right FemNeck... rec> continue Rx. ~  labs 1/10 showed Vit D level = 39... rec> continue 1000 u OTC daily. ~  2/12: she's been on Alendronate for yrs & we discussed stopping this med for a "drug holiday"... ~  labs 2/12 showed Vit D level = 52... continue same; but we decided on a Bisphos drug holiday for the next yr. ~  Labs  2/13 showed Vit D level = 59... Continue same. ~  BMD 2/14 showed TScores +0.4 in Spine , and -1.8 in right FemNeck... As she is stable we will continue off Bisphos rx...  ANXIETY (ICD-300.00)  HEALTH MAINTANENCE = all up-to-date. ~  COLONOSCOPY:  done  3/11 by DrStark & neg x for divertics... ~  GYN:  DrHorvath - on Estradiol + Medroxyprogesterone... ~  MAMMOGRAM:  at [facility] yearly... ~  BMD:  done 1/10 here w/ OSTEOPENIA, TScores +0.2  to -1.9, sl better in spine & sl worse in hips. ~   VACCINATIONS:  she gets the yearly Flu vaccine; PNEUMOVAX 11/99 & 1/10 here,  TETANUS shot 11/99 & 1/10 here...  ~  NOTE:  she takes ACYCLOVIR 400Tid x5d as needed for HSV1 outbreaks...   Past Surgical History  Procedure Date  . Right rotator cuff surgery 04/2005    Dr. Leslee Home  . Laminectomy and microdiscectomy lumbar spine 02/2002    Dr. Leslee Home  . Holmium laser lithotripsy for right ureteral stone 6/12    by DrGrapey  . Retropubic suburethral sling for urinary incontinence 6/12     by DrGrapey  . Cystoscopy w/ ureteral stent placement 07/14/2012    Procedure: CYSTOSCOPY WITH RETROGRADE PYELOGRAM/URETERAL STENT PLACEMENT;  Surgeon: Kathi Ludwig, MD;  Location: [facility];  Service: Urology;  Laterality: Right;  . Holmium laser application 07/14/2012    Procedure: HOLMIUM LASER APPLICATION;  Surgeon: Kathi Ludwig, MD;  Location: [facility];  Service: Urology;  Laterality: Right;    Outpatient Encounter Prescriptions as of 10/02/2012  Medication Sig Dispense Refill  . aspirin 81 MG tablet Take 81 mg by mouth daily.        . bisoprolol-hydrochlorothiazide (ZIAC) 5-6.25 MG per tablet Take 1 tablet by mouth daily.      . Calcium Carbonate-Vit D-Min (CALTRATE 600+D PLUS) 600-400 MG-UNIT per tablet Take 1 tablet by mouth daily.        . cholecalciferol (VITAMIN D) 1000 UNITS tablet Take 1,000 Units by mouth daily.        Marland Kitchen lisinopril (PRINIVIL,ZESTRIL) 20 MG tablet Take 20 mg by mouth daily.      . Multiple Vitamins-Minerals (WOMENS MULTIVITAMIN PLUS) TABS Take 1 tablet by mouth daily.        . simvastatin (ZOCOR) 20 MG tablet Take 20 mg by mouth every evening.      . vitamin C (ASCORBIC ACID) 500 MG tablet Take 500 mg by mouth daily.        . [DISCONTINUED] methenamine-sodium biphosphonate (UROQUID) 500-500 MG per tablet Take 1 tablet by mouth 1 day or 1 dose.  14 tablet  1  . [DISCONTINUED] phenazopyridine (AZO DINE) 95 MG tablet Take 95 mg by mouth 3 (three) times daily as  needed. Bladder irritation      . [DISCONTINUED] traMADol-acetaminophen (ULTRACET) 37.5-325 MG per tablet Take 1 tablet by mouth every 6 (six) hours as needed for pain.  30 tablet  2  . [DISCONTINUED] URELLE (URELLE/URISED) 81 MG TABS Take 1 tablet by mouth every 6 (six) hours as needed. Urinary tract discomfort.        Allergies  Allergen Reactions  . Sulfonamide Derivatives     REACTION: nausea    Review of Systems       The patient denies fever, chills, sweats, anorexia, fatigue, weakness, malaise, weight loss, sleep disorder, blurring, diplopia, eye irritation, eye discharge, vision loss, eye pain, photophobia, earache, ear discharge, tinnitus, decreased hearing, nasal congestion, nosebleeds, sore throat, hoarseness, chest pain, palpitations, syncope, dyspnea on exertion, orthopnea, PND, peripheral edema, cough, dyspnea at rest, excessive sputum, hemoptysis, wheezing, pleurisy, nausea, vomiting, diarrhea, constipation, change in bowel habits, abdominal pain, melena, hematochezia, jaundice, gas/bloating, indigestion/heartburn, dysphagia, odynophagia, dysuria, hematuria, urinary frequency, urinary hesitancy, nocturia, back pain, joint  pain, joint swelling, muscle cramps, muscle weakness, stiffness, sciatica, restless legs, leg pain at night, leg pain with exertion, rash, itching, dryness, suspicious lesions, paralysis, paresthesias, seizures, tremors, vertigo, transient blindness, frequent falls, frequent headaches, difficulty walking, depression, anxiety, memory loss, confusion, cold intolerance, heat intolerance, polydipsia, polyphagia, polyuria, unusual weight change, abnormal bruising, bleeding, enlarged lymph nodes, urticaria, allergic rash, hay fever, and recurrent infections.     Objective:   Physical Exam     WD, WN, 72 y/o WF in NAD... GENERAL:  Alert & oriented; pleasant & cooperative... HEENT:  Benson/AT, EOM-wnl, PERRLA, Fundi-benign, EACs-clear, TMs-wnl, NOSE-clear, THROAT-clear &  wnl. NECK:  Supple w/ full ROM; no JVD; normal carotid impulses w/o bruits; no thyromegaly or nodules palpated; no lymphadenopathy. CHEST:  Clear to P & A; without wheezes/ rales/ or rhonchi. HEART:  Regular Rhythm; without murmurs/ rubs/ or gallops. ABDOMEN:  Soft & nontender; normal bowel sounds; no organomegaly or masses detected. EXT: without deformities, mild arthritic changes; no varicose veins/ venous insuffic/ or edema. NEURO:  CN's intact; motor testing normal; sensory testing normal; gait normal & balance OK. DERM:  No lesions noted; no rash etc...  RADIOLOGY DATA:  Reviewed in the EPIC EMR & discussed w/ the patient...  LABORATORY DATA:  Reviewed in the EPIC EMR & discussed w/ the patient...   Assessment & Plan:    Kidney Stones> Eval & Rx from [facility]...  PULM NODULE>   8mm RML nodule seen on CT scan, not visible on CXRs to date; she is low risk (never smoker etc) & f/u CT Chest due now ==> no change in 8mm nodule & Radiology rec f/u CT in 18-86mo...   Hx Sinusitis & Bronchitis>  Recent [facility]itic [facility]ac [facility]/ ?antibiotics & cough syrup- resolved...  HBP>  Controlled on meds...  CHOL>  Stable on the Simva20; we reviewed diet...  GI>  GERD, Divertics>  Stable & up to date...  DJD, Osteopenia>  Stable & doing well; BMD showed stable TScores & we decided to leave the Bisphos off for another cycle...  Anxiety>  She is handling the stress well, does not request anxiolytics...   Patient's Medications  New Prescriptions   LOSARTAN (COZAAR) 100 MG TABLET    Take 1 tablet (100 mg total) by mouth daily.  Previous Medications   ASPIRIN 81 MG TABLET    Take 81 mg by mouth daily.     CALCIUM CARBONATE-VIT D-MIN (CALTRATE 600+D PLUS) 600-400 MG-UNIT PER TABLET    Take 1 tablet by mouth daily.     CHOLECALCIFEROL (VITAMIN D) 1000 UNITS TABLET    Take 1,000 Units by mouth daily.     MULTIPLE VITAMINS-MINERALS (WOMENS MULTIVITAMIN PLUS) TABS    Take 1  tablet by mouth daily.     VITAMIN C (ASCORBIC ACID) 500 MG TABLET    Take 500 mg by mouth daily.    Modified Medications   Modified Medication Previous Medication   BISOPROLOL-HYDROCHLOROTHIAZIDE (ZIAC) 5-6.25 MG PER TABLET bisoprolol-hydrochlorothiazide (ZIAC) 5-6.25 MG per tablet      Take 1 tablet by mouth daily.    Take 1 tablet by mouth daily.  Discontinued Medications   LISINOPRIL (PRINIVIL,ZESTRIL) 20 MG TABLET    Take 20 mg by mouth daily.   METHENAMINE-SODIUM BIPHOSPHONATE (UROQUID) 500-500 MG PER TABLET    Take 1 tablet by mouth 1 day or 1 dose.   PHENAZOPYRIDINE (AZO DINE) 95 MG TABLET    Take 95 mg by mouth 3 (three) times daily  as needed. Bladder irritation   SIMVASTATIN (ZOCOR) 20 MG TABLET    Take 20 mg by mouth every evening.   TRAMADOL-ACETAMINOPHEN (ULTRACET) 37.5-325 MG PER TABLET    Take 1 tablet by mouth every 6 (six) hours as needed for pain.   URELLE (URELLE/URISED) 81 MG TABS    Take 1 tablet by mouth every 6 (six) hours as needed. Urinary tract discomfort.

## 2012-10-03 LAB — VITAMIN D 25 HYDROXY (VIT D DEFICIENCY, FRACTURES): Vit D, 25-Hydroxy: 63 ng/mL (ref 30–89)

## 2012-10-04 ENCOUNTER — Telehealth: Payer: Self-pay | Admitting: Pulmonary Disease

## 2012-10-04 NOTE — Telephone Encounter (Signed)
Called and spoke with pt and she is aware of lab results per SN. Pt voiced her understanding and nothing further is needed 

## 2012-10-07 ENCOUNTER — Ambulatory Visit (INDEPENDENT_AMBULATORY_CARE_PROVIDER_SITE_OTHER)
Admission: RE | Admit: 2012-10-07 | Discharge: 2012-10-07 | Disposition: A | Discharge status: Home / Self Care | Payer: Medicare Other | Source: Ambulatory Visit

## 2012-10-07 DIAGNOSIS — M810 Age-related osteoporosis without current pathological fracture: Secondary | ICD-10-CM

## 2012-10-24 ENCOUNTER — Other Ambulatory Visit: Payer: Self-pay | Admitting: Orthopedic Surgery

## 2012-10-25 ENCOUNTER — Encounter (HOSPITAL_BASED_OUTPATIENT_CLINIC_OR_DEPARTMENT_OTHER): Payer: Self-pay | Admitting: *Deleted

## 2012-10-25 NOTE — Progress Notes (Signed)
Pt instructed npo p mn 2/25 x ziac w sip of water.  To [facility] 2/26 @# 1000.  Needs istat on arrival.  Ekg, 2-d echo in epic.

## 2012-10-30 ENCOUNTER — Encounter (HOSPITAL_BASED_OUTPATIENT_CLINIC_OR_DEPARTMENT_OTHER)
Admission: RE | Disposition: A | Discharge status: Home / Self Care | Payer: Self-pay | Source: Ambulatory Visit | Attending: Orthopedic Surgery

## 2012-10-30 ENCOUNTER — Other Ambulatory Visit: Payer: Self-pay | Admitting: Pulmonary Disease

## 2012-10-30 ENCOUNTER — Encounter (HOSPITAL_BASED_OUTPATIENT_CLINIC_OR_DEPARTMENT_OTHER): Payer: Self-pay | Admitting: *Deleted

## 2012-10-30 ENCOUNTER — Encounter (HOSPITAL_BASED_OUTPATIENT_CLINIC_OR_DEPARTMENT_OTHER): Payer: Self-pay | Admitting: Anesthesiology

## 2012-10-30 ENCOUNTER — Ambulatory Visit (HOSPITAL_BASED_OUTPATIENT_CLINIC_OR_DEPARTMENT_OTHER): Payer: Medicare Other | Admitting: Anesthesiology

## 2012-10-30 ENCOUNTER — Ambulatory Visit (HOSPITAL_BASED_OUTPATIENT_CLINIC_OR_DEPARTMENT_OTHER)
Admission: RE | Admit: 2012-10-30 | Discharge: 2012-10-30 | Disposition: A | Discharge status: Home / Self Care | Payer: Medicare Other | Source: Ambulatory Visit | Attending: Orthopedic Surgery | Admitting: Orthopedic Surgery

## 2012-10-30 DIAGNOSIS — Z79899 Other long term (current) drug therapy: Secondary | ICD-10-CM | POA: Insufficient documentation

## 2012-10-30 DIAGNOSIS — J40 Bronchitis, not specified as acute or chronic: Secondary | ICD-10-CM

## 2012-10-30 DIAGNOSIS — I872 Venous insufficiency (chronic) (peripheral): Secondary | ICD-10-CM

## 2012-10-30 DIAGNOSIS — M1990 Unspecified osteoarthritis, unspecified site: Secondary | ICD-10-CM

## 2012-10-30 DIAGNOSIS — R911 Solitary pulmonary nodule: Secondary | ICD-10-CM

## 2012-10-30 DIAGNOSIS — E7800 Pure hypercholesterolemia, unspecified: Secondary | ICD-10-CM

## 2012-10-30 DIAGNOSIS — J329 Chronic sinusitis, unspecified: Secondary | ICD-10-CM

## 2012-10-30 DIAGNOSIS — N200 Calculus of kidney: Secondary | ICD-10-CM

## 2012-10-30 DIAGNOSIS — M810 Age-related osteoporosis without current pathological fracture: Secondary | ICD-10-CM

## 2012-10-30 DIAGNOSIS — K5730 Diverticulosis of large intestine without perforation or abscess without bleeding: Secondary | ICD-10-CM

## 2012-10-30 DIAGNOSIS — Z7982 Long term (current) use of aspirin: Secondary | ICD-10-CM | POA: Insufficient documentation

## 2012-10-30 DIAGNOSIS — F411 Generalized anxiety disorder: Secondary | ICD-10-CM

## 2012-10-30 DIAGNOSIS — G5600 Carpal tunnel syndrome, unspecified upper limb: Secondary | ICD-10-CM | POA: Insufficient documentation

## 2012-10-30 DIAGNOSIS — M19049 Primary osteoarthritis, unspecified hand: Secondary | ICD-10-CM | POA: Insufficient documentation

## 2012-10-30 DIAGNOSIS — I10 Essential (primary) hypertension: Secondary | ICD-10-CM

## 2012-10-30 DIAGNOSIS — IMO0002 Reserved for concepts with insufficient information to code with codable children: Secondary | ICD-10-CM

## 2012-10-30 DIAGNOSIS — K219 Gastro-esophageal reflux disease without esophagitis: Secondary | ICD-10-CM

## 2012-10-30 HISTORY — DX: Cysts of unspecified eye, unspecified eyelid: H02.829

## 2012-10-30 HISTORY — PX: CARPAL TUNNEL RELEASE: SHX101

## 2012-10-30 HISTORY — PX: FINGER ARTHROPLASTY: SHX5017

## 2012-10-30 LAB — POCT I-STAT 4, (NA,K, GLUC, HGB,HCT)
HCT: 46 % (ref 36.0–46.0)
Hemoglobin: 15.6 g/dL — ABNORMAL HIGH (ref 12.0–15.0)

## 2012-10-30 SURGERY — CARPAL TUNNEL RELEASE
Anesthesia: General | Site: Thumb | Laterality: Left | Wound class: Clean

## 2012-10-30 MED ORDER — ONDANSETRON HCL 4 MG/2ML IJ SOLN
INTRAMUSCULAR | Status: DC | PRN
  Administered 2012-10-30: 4 mg via INTRAVENOUS

## 2012-10-30 MED ORDER — LIDOCAINE HCL (CARDIAC) 20 MG/ML IV SOLN
INTRAVENOUS | Status: DC | PRN
  Administered 2012-10-30: 60 mg via INTRAVENOUS

## 2012-10-30 MED ORDER — FENTANYL CITRATE 0.05 MG/ML IJ SOLN
INTRAMUSCULAR | Status: DC | PRN
  Administered 2012-10-30: 25 ug via INTRAVENOUS
  Administered 2012-10-30: 50 ug via INTRAVENOUS
  Administered 2012-10-30 (×5): 25 ug via INTRAVENOUS

## 2012-10-30 MED ORDER — DEXAMETHASONE SODIUM PHOSPHATE 4 MG/ML IJ SOLN
INTRAMUSCULAR | Status: DC | PRN
  Administered 2012-10-30: 10 mg via INTRAVENOUS

## 2012-10-30 MED ORDER — POVIDONE-IODINE 7.5 % EX SOLN
Freq: Once | CUTANEOUS | Status: DC
  Filled 2012-10-30: qty 118

## 2012-10-30 MED ORDER — KETOROLAC TROMETHAMINE 30 MG/ML IJ SOLN
15.0000 mg | Freq: Once | INTRAMUSCULAR | Status: DC | PRN
  Filled 2012-10-30: qty 1

## 2012-10-30 MED ORDER — OXYCODONE-ACETAMINOPHEN 5-325 MG PO TABS
1.0000 | ORAL_TABLET | ORAL | Status: AC | PRN
  Administered 2012-10-30: 1 via ORAL
  Filled 2012-10-30: qty 1

## 2012-10-30 MED ORDER — OXYCODONE-ACETAMINOPHEN 5-325 MG PO TABS: 1.0000 | ORAL_TABLET | ORAL | Status: DC | PRN

## 2012-10-30 MED ORDER — PROMETHAZINE HCL 25 MG/ML IJ SOLN
6.2500 mg | INTRAMUSCULAR | Status: DC | PRN
  Filled 2012-10-30: qty 1

## 2012-10-30 MED ORDER — LACTATED RINGERS IV SOLN
INTRAVENOUS | Status: DC
  Administered 2012-10-30 (×2): via INTRAVENOUS
  Filled 2012-10-30: qty 1000

## 2012-10-30 MED ORDER — PROPOFOL 10 MG/ML IV BOLUS
INTRAVENOUS | Status: DC | PRN
  Administered 2012-10-30: 180 mg via INTRAVENOUS

## 2012-10-30 MED ORDER — FENTANYL CITRATE 0.05 MG/ML IJ SOLN
100.0000 ug | Freq: Once | INTRAMUSCULAR | Status: AC
  Administered 2012-10-30: 100 ug via INTRAVENOUS
  Filled 2012-10-30: qty 2

## 2012-10-30 MED ORDER — FENTANYL CITRATE 0.05 MG/ML IJ SOLN
25.0000 ug | INTRAMUSCULAR | Status: DC | PRN
  Administered 2012-10-30: 25 ug via INTRAVENOUS
  Filled 2012-10-30: qty 1

## 2012-10-30 MED ORDER — CEPHALEXIN 500 MG PO CAPS: 500.0000 mg | ORAL_CAPSULE | Freq: Four times a day (QID) | ORAL | Status: DC

## 2012-10-30 MED ORDER — HEMOSTATIC AGENTS (NO CHARGE) OPTIME
TOPICAL | Status: DC | PRN
  Administered 2012-10-30: 1 via TOPICAL

## 2012-10-30 MED ORDER — METHOCARBAMOL 500 MG PO TABS: 500.0000 mg | ORAL_TABLET | Freq: Four times a day (QID) | ORAL | Status: DC | PRN

## 2012-10-30 MED ORDER — LACTATED RINGERS IV SOLN
INTRAVENOUS | Status: DC
  Administered 2012-10-30: 11:00:00 via INTRAVENOUS
  Filled 2012-10-30: qty 1000

## 2012-10-30 MED ORDER — ROPIVACAINE HCL 5 MG/ML IJ SOLN
INTRAMUSCULAR | Status: DC | PRN
  Administered 2012-10-30: 20 mL

## 2012-10-30 MED ORDER — MIDAZOLAM HCL 2 MG/2ML IJ SOLN
1.0000 mg | Freq: Once | INTRAMUSCULAR | Status: AC
  Administered 2012-10-30: 1 mg via INTRAVENOUS
  Filled 2012-10-30: qty 1

## 2012-10-30 MED ORDER — CEFAZOLIN SODIUM-DEXTROSE 2-3 GM-% IV SOLR
2.0000 g | INTRAVENOUS | Status: DC
  Filled 2012-10-30: qty 50

## 2012-10-30 SURGICAL SUPPLY — 69 items
BANDAGE COBAN STERILE 2 (GAUZE/BANDAGES/DRESSINGS) ×3 IMPLANT
BANDAGE CONFORM 2  STR LF (GAUZE/BANDAGES/DRESSINGS) IMPLANT
BANDAGE CONFORM 3  STR LF (GAUZE/BANDAGES/DRESSINGS) ×3 IMPLANT
BANDAGE ELASTIC 3 VELCRO ST LF (GAUZE/BANDAGES/DRESSINGS) ×3 IMPLANT
BANDAGE ELASTIC 4 VELCRO ST LF (GAUZE/BANDAGES/DRESSINGS) ×1 IMPLANT
BANDAGE GAUZE ELAST BULKY 4 IN (GAUZE/BANDAGES/DRESSINGS) ×1 IMPLANT
BLADE FLAT FINE (BLADE) ×3 IMPLANT
BLADE OSC/SAG .038X5.5 CUT EDG (BLADE) ×1 IMPLANT
BLADE SURG 15 STRL LF DISP TIS (BLADE) ×4 IMPLANT
BLADE SURG 15 STRL SS (BLADE) ×6
BNDG CMPR 9X4 STRL LF SNTH (GAUZE/BANDAGES/DRESSINGS) ×2
BNDG ESMARK 4X9 LF (GAUZE/BANDAGES/DRESSINGS) ×3 IMPLANT
CLOTH BEACON ORANGE TIMEOUT ST (SAFETY) ×3 IMPLANT
CORDS BIPOLAR (ELECTRODE) ×3 IMPLANT
COVER TABLE BACK 60X90 (DRAPES) ×3 IMPLANT
DRAPE EXTREMITY T 121X128X90 (DRAPE) ×3 IMPLANT
DRAPE LG THREE QUARTER DISP (DRAPES) ×6 IMPLANT
DRAPE U-SHAPE 47X51 STRL (DRAPES) ×3 IMPLANT
DRSG ADAPTIC 3X8 NADH LF (GAUZE/BANDAGES/DRESSINGS) ×1 IMPLANT
DRSG EMULSION OIL 3X3 NADH (GAUZE/BANDAGES/DRESSINGS) ×3 IMPLANT
DURAPREP 26ML APPLICATOR (WOUND CARE) ×3 IMPLANT
ELECT REM PT RETURN 9FT ADLT (ELECTROSURGICAL) ×3
ELECTRODE REM PT RTRN 9FT ADLT (ELECTROSURGICAL) IMPLANT
GLOVE BIO SURGEON STRL SZ 6.5 (GLOVE) ×1 IMPLANT
GLOVE BIO SURGEON STRL SZ7.5 (GLOVE) ×2 IMPLANT
GLOVE BIOGEL PI IND STRL 6.5 (GLOVE) IMPLANT
GLOVE BIOGEL PI IND STRL 7.0 (GLOVE) IMPLANT
GLOVE BIOGEL PI IND STRL 8 (GLOVE) IMPLANT
GLOVE BIOGEL PI INDICATOR 6.5 (GLOVE) ×2
GLOVE BIOGEL PI INDICATOR 7.0 (GLOVE) ×2
GLOVE BIOGEL PI INDICATOR 8 (GLOVE) ×1
GLOVE ECLIPSE 6.0 STRL STRAW (GLOVE) ×1 IMPLANT
GLOVE ECLIPSE 8.0 STRL XLNG CF (GLOVE) ×4 IMPLANT
GLOVE INDICATOR 8.0 STRL GRN (GLOVE) ×4 IMPLANT
GOWN PREVENTION PLUS LG XLONG (DISPOSABLE) ×6 IMPLANT
GOWN STRL REIN XL XLG (GOWN DISPOSABLE) ×3 IMPLANT
MARKER SKIN DUAL TIP RULER LAB (MISCELLANEOUS) ×3 IMPLANT
MICROAIRE PIN HEAD WHITE ×1 IMPLANT
NEEDLE 27GAX1X1/2 (NEEDLE) IMPLANT
NEEDLE HYPO 22GX1.5 SAFETY (NEEDLE) ×3 IMPLANT
NS IRRIG 500ML POUR BTL (IV SOLUTION) ×3 IMPLANT
PACK BASIN DAY SURGERY FS (CUSTOM PROCEDURE TRAY) ×3 IMPLANT
PAD CAST 3X4 CTTN HI CHSV (CAST SUPPLIES) ×2 IMPLANT
PAD CAST 4YDX4 CTTN HI CHSV (CAST SUPPLIES) IMPLANT
PADDING CAST ABS 4INX4YD NS (CAST SUPPLIES) ×1
PADDING CAST ABS COTTON 4X4 ST (CAST SUPPLIES) ×2 IMPLANT
PADDING CAST COTTON 3X4 STRL (CAST SUPPLIES) ×3
PADDING CAST COTTON 4X4 STRL (CAST SUPPLIES)
PENCIL BUTTON HOLSTER BLD 10FT (ELECTRODE) IMPLANT
SLING ULTRA II MEDIUM (SOFTGOODS) ×1 IMPLANT
SPLINT PLASTER CAST XFAST 3X15 (CAST SUPPLIES) IMPLANT
SPLINT PLASTER XTRA FASTSET 3X (CAST SUPPLIES) ×1
SPONGE GAUZE 2X2 8PLY STRL LF (GAUZE/BANDAGES/DRESSINGS) ×3 IMPLANT
SPONGE GAUZE 4X4 12PLY (GAUZE/BANDAGES/DRESSINGS) ×3 IMPLANT
STOCKINETTE 4X48 STRL (DRAPES) ×3 IMPLANT
SUT ETHILON 4 0 PS 2 18 (SUTURE) ×8 IMPLANT
SUT VIC AB 3-0 SH 27 (SUTURE) ×3
SUT VIC AB 3-0 SH 27X BRD (SUTURE) ×4 IMPLANT
SUT VIC AB 4-0 P-3 18XBRD (SUTURE) IMPLANT
SUT VIC AB 4-0 P3 18 (SUTURE)
SUT VIC AB 4-0 SH 27 (SUTURE)
SUT VIC AB 4-0 SH 27XANBCTRL (SUTURE) IMPLANT
SYR BULB 3OZ (MISCELLANEOUS) ×3 IMPLANT
SYR BULB IRRIGATION 50ML (SYRINGE) ×3 IMPLANT
SYR CONTROL 10ML LL (SYRINGE) ×3 IMPLANT
TOWEL OR 17X24 6PK STRL BLUE (TOWEL DISPOSABLE) ×7 IMPLANT
UNDERPAD 30X30 INCONTINENT (UNDERPADS AND DIAPERS) ×3 IMPLANT
WATER STERILE IRR 500ML POUR (IV SOLUTION) ×3 IMPLANT
k-wire 0.45  x 2 ×2 IMPLANT

## 2012-10-30 NOTE — H&P (Signed)
Mary [name] is an 72 y.o. female.   Chief Complaint:pain and numbness lt hand HPI: she has tenderness at c-mc joint thumb with advasnced oa on xray and incomplete pain relief with non-surgical treatment, also delayed median nerve conduction studies  Past Medical History  Diagnosis Date  . History of sinusitis   . Bronchitis   . Hypertension   . Venous insufficiency   . Hypercholesteremia   . GERD (gastroesophageal reflux disease)   . Diverticulosis of colon   . History of cyst of breast   . DJD (degenerative joint disease)   . DDD (degenerative disc disease)   . Carpal tunnel syndrome     bilateral  . Osteoporosis   . Anxiety   . Kidney stones   . Complication of anesthesia     slow awakening  . Eyelid cyst     Past Surgical History  Procedure Laterality Date  . Right rotator cuff surgery  04/2005    Dr. Leslee Home  . Laminectomy and microdiscectomy lumbar spine  02/2002    Dr. Leslee Home  . Holmium laser lithotripsy for right ureteral stone  6/12    by DrGrapey  . Retropubic suburethral sling for urinary incontinence  6/12     by DrGrapey  . Cystoscopy w/ ureteral stent placement  07/14/2012    Procedure: CYSTOSCOPY WITH RETROGRADE PYELOGRAM/URETERAL STENT PLACEMENT;  Surgeon: Kathi Ludwig, MD;  Location: [facility];  Service: Urology;  Laterality: Right;  . Holmium laser application  07/14/2012    Procedure: HOLMIUM LASER APPLICATION;  Surgeon: Kathi Ludwig, MD;  Location: [facility];  Service: Urology;  Laterality: Right;  . Carpal tunnel release Right 2009    History reviewed. No pertinent family history. Social History:  reports that she has never smoked. She has never used smokeless tobacco. She reports that she does not drink alcohol or use illicit drugs.  Allergies:  Allergies  Allergen Reactions  . Sulfonamide Derivatives     REACTION: nausea    Medications Prior to Admission  Medication Sig Dispense Refill  . aspirin 81 MG tablet Take 81 mg by  mouth daily.        . bisoprolol-hydrochlorothiazide (ZIAC) 5-6.25 MG per tablet Take 1 tablet by mouth daily.  90 tablet  3  . bisoprolol-hydrochlorothiazide (ZIAC) 5-6.25 MG per tablet TAKE 1 TABLET EVERY DAY  90 tablet  3  . Calcium Carbonate-Vit D-Min (CALTRATE 600+D PLUS) 600-400 MG-UNIT per tablet Take 1 tablet by mouth daily.        . cholecalciferol (VITAMIN D) 1000 UNITS tablet Take 1,000 Units by mouth daily.        Marland Kitchen lisinopril (PRINIVIL,ZESTRIL) 20 MG tablet TAKE 1 TABLET EVERY DAY  90 tablet  3  . losartan (COZAAR) 100 MG tablet Take 1 tablet (100 mg total) by mouth daily.  90 tablet  3  . Multiple Vitamins-Minerals (WOMENS MULTIVITAMIN PLUS) TABS Take 1 tablet by mouth daily.        . vitamin C (ASCORBIC ACID) 500 MG tablet Take 500 mg by mouth daily.          No results found for this or any previous visit (from the past 48 hour(s)). No results found.  ROS  Blood pressure 157/77, pulse 74, temperature 97 F (36.1 C), temperature source Oral, resp. rate 18, height 5' (1.524 m), weight 62.738 kg (138 lb 5 oz), SpO2 99.00%. Physical Exam  Constitutional: She is oriented to person, place, and time. She appears well-developed and well-nourished.  HENT:  Head: Normocephalic and atraumatic.  Right Ear: External ear normal.  Left Ear: External ear normal.  Nose: Nose normal.  Mouth/Throat: Oropharynx is clear and moist.  Eyes: Conjunctivae and EOM are normal. Pupils are equal, round, and reactive to light.  Neck: Normal range of motion. Neck supple.  Cardiovascular: Normal rate, regular rhythm, normal heart sounds and intact distal pulses.   Respiratory: Effort normal and breath sounds normal.  GI: Soft. Bowel sounds are normal.  Musculoskeletal: Normal range of motion. She exhibits tenderness.  Tender c-mc joint lt thumb and numbness median nerve digits lt hand  Neurological: She is alert and oriented to person, place, and time. She has normal reflexes.  Skin: Skin is warm  and dry.  Psychiatric: She has a normal mood and affect. Her behavior is normal. Judgment and thought content normal.     Assessment/Plan Pain and numbness lt hand due to c-mc arthritis thumb and carpal tunnel syndrome 1-carpal tunnel decompression 2-c-mc interpositionalarthroplasty with palmaris longus graft lt thumb  Saed Hudlow P 10/30/2012, 10:37 AM

## 2012-10-30 NOTE — Anesthesia Procedure Notes (Addendum)
Anesthesia Regional Block:  Axillary brachial plexus block  Pre-Anesthetic Checklist: ,, timeout performed, Correct Patient, Correct Site, Correct Laterality, Correct Procedure, Correct Position, site marked, Risks and benefits discussed,  Surgical consent,  Pre-op evaluation,  At surgeon's request and post-op pain management  Laterality: Left  Prep: chloraprep       Needles:  Injection technique: Single-shot  Needle Type: Stimiplex          Additional Needles:  Procedures: ultrasound guided (picture in chart) Axillary brachial plexus block  Nerve Stimulator or Paresthesia:  Response: 40 mA,   Additional Responses:   Narrative:  Injection made incrementally with aspirations every 5 mL.  Performed by: Personally  Anesthesiologist: Eilene Ghazi MD  Additional Notes: Patient tolerated the procedure well without complications  Axillary brachial plexus block Procedure Name: LMA Insertion Date/Time: 10/30/2012 12:12 PM Performed by: Fran Lowes Pre-anesthesia Checklist: Patient identified, Emergency Drugs available, Suction available and Patient being monitored Patient Re-evaluated:Patient Re-evaluated prior to inductionOxygen Delivery Method: Circle System Utilized Preoxygenation: Pre-oxygenation with 100% oxygen Intubation Type: IV induction Ventilation: Mask ventilation without difficulty LMA: LMA inserted LMA Size: 4.0 Number of attempts: 1 Airway Equipment and Method: bite block Placement Confirmation: positive ETCO2 Tube secured with: Tape Dental Injury: Teeth and Oropharynx as per pre-operative assessment

## 2012-10-30 NOTE — Anesthesia Postprocedure Evaluation (Signed)
Anesthesia Post Note  Patient: Mary [name]  Procedure(s) Performed: Procedure(s) (LRB): CARPAL TUNNEL RELEASE (Left) thumb ARTHROPLASTY (Left)  Anesthesia type: General  Patient location: PACU  Post pain: Pain level controlled  Post assessment: Post-op Vital signs reviewed  Last Vitals: BP 122/61  Pulse 74  Temp(Src) 36.2 C (Oral)  Resp 14  Ht 5' (1.524 m)  Wt 138 lb 5 oz (62.738 kg)  BMI 27.01 kg/m2  SpO2 96%  Post vital signs: Reviewed  Level of consciousness: sedated  Complications: No apparent anesthesia complications

## 2012-10-30 NOTE — Anesthesia Preprocedure Evaluation (Addendum)
Anesthesia Evaluation  Patient identified by MRN, date of birth, ID band Patient awake    Reviewed: Allergy & Precautions, H&P , NPO status , Patient's Chart, lab work & pertinent test results  Airway Mallampati: II TM Distance: >3 FB Neck ROM: Full    Dental no notable dental hx.    Pulmonary neg pulmonary ROS,  breath sounds clear to auscultation  Pulmonary exam normal       Cardiovascular hypertension, Pt. on medications negative cardio ROS  Rhythm:Regular Rate:Normal     Neuro/Psych negative neurological ROS  negative psych ROS   GI/Hepatic negative GI ROS, Neg liver ROS,   Endo/Other  negative endocrine ROS  Renal/GU negative Renal ROS  negative genitourinary   Musculoskeletal negative musculoskeletal ROS (+)   Abdominal   Peds negative pediatric ROS (+)  Hematology negative hematology ROS (+)   Anesthesia Other Findings   Reproductive/Obstetrics negative OB ROS                           Anesthesia Physical Anesthesia Plan  ASA: II  Anesthesia Plan: General   Post-op Pain Management:    Induction: Intravenous  Airway Management Planned: LMA  Additional Equipment:   Intra-op Plan:   Post-operative Plan:   Informed Consent: I have reviewed the patients History and Physical, chart, labs and discussed the procedure including the risks, benefits and alternatives for the proposed anesthesia with the patient or authorized representative who has indicated his/her understanding and acceptance.   Dental advisory given  Plan Discussed with: CRNA and Surgeon  Anesthesia Plan Comments:         Anesthesia Quick Evaluation

## 2012-10-30 NOTE — Transfer of Care (Signed)
Immediate Anesthesia Transfer of Care Note  Patient: Mary [name]  Procedure(s) Performed: Procedure(s) (LRB): CARPAL TUNNEL RELEASE (Left) thumb ARTHROPLASTY (Left)  Patient Location: Patient transported to PACU with oxygen via face mask at 4 Liters / Min  Anesthesia Type: General  Level of Consciousness: awake and alert   Airway & Oxygen Therapy: Patient Spontanous Breathing and Patient connected to face mask oxygen  Post-op Assessment: Report given to PACU RN and Post -op Vital signs reviewed and stable  Post vital signs: Reviewed and stable  Dentition: Teeth and oropharynx remain in pre-op condition  Complications: No apparent anesthesia complications

## 2012-10-30 NOTE — Brief Op Note (Signed)
10/30/2012  2:26 PM  PATIENT:  Mary [name]  72 y.o. female  PRE-OPERATIVE DIAGNOSIS:  left hand carpal tunnel syndrom/Arthritis of the Left Hand  POST-OPERATIVE DIAGNOSIS:  left hand carpal tunnel syndrom/Arthritis of the Left Hand  PROCEDURE:  Procedure(s) with comments: CARPAL TUNNEL RELEASE (Left) thumb ARTHROPLASTY (Left) - carpometacarpal arthroplasty thumb  SURGEON:  Surgeon(s) and Role:    * Drucilla Schmidt, MD - Primary  PHYSICIAN ASSISTANT:   ASSISTANTS:Mr Idolina Primer [facility]   ANESTHESIA:   regional and general  EBL:  Total I/O In: 1500 [I.V.:1500] Out: -   BLOOD ADMINISTERED:none  DRAINS: none   LOCAL MEDICATIONS USED:  MARCAINE     SPECIMEN:  No Specimen  DISPOSITION OF SPECIMEN:  N/A  COUNTS:  YES  TOURNIQUET:   Total Tourniquet Time Documented: Upper Arm (Left) - 87 minutes Total: Upper Arm (Left) - 87 minutes   DICTATION: .Other Dictation: Dictation Number [id number]  PLAN OF CARE: Discharge to home after PACU  PATIENT DISPOSITION:  PACU - hemodynamically stable.   Delay start of Pharmacological VTE agent (>24hrs) due to surgical blood loss or risk of bleeding: yes

## 2012-10-31 ENCOUNTER — Encounter (HOSPITAL_BASED_OUTPATIENT_CLINIC_OR_DEPARTMENT_OTHER): Payer: Self-pay | Admitting: Orthopedic Surgery

## 2012-10-31 NOTE — Op Note (Signed)
NAMECHASTIDY, RANKER                  ACCOUNT NO.:  [id number]  MEDICAL RECORD NO.:  [id number]  LOCATION:                                 FACILITY:  PHYSICIAN:  Marlowe Kays, M.D.  DATE OF BIRTH:  [birth year]  DATE OF PROCEDURE:  10/30/2012 DATE OF DISCHARGE:                              OPERATIVE REPORT   PREOPERATIVE DIAGNOSES: 1. Carpal tunnel syndrome. 2. Advanced osteoarthritis of carpometacarpal joint of the thumb, both     left hand.  POSTOPERATIVE DIAGNOSES: 1. Carpal tunnel syndrome. 2. Advanced osteoarthritis of carpometacarpal joint of the thumb, both     left hand.  OPERATION: 1. Carpal tunnel decompression, left wrist and hand. 2. Interpositional arthroplasty of carpometacarpal joint of the left     thumb with Gelfoam graft.  SURGEON:  Marlowe Kays, M.D.  ASSISTANTDruscilla Brownie. Cherlynn June.  ANESTHESIA:  General preceded by interscalene block.  PATHOLOGY AND JUSTIFICATION FOR PROCEDURE:  She had numbness of the median nerve distribution digits with delayed median nerve conduction studies on testing.  She also had advanced osteoarthritis of the carpometacarpal joint of her thumb, which improved refractory to nonsurgical treatment.  Mr. Angie Fava assistance was necessary because of the complexity of the surgery performed.  PROCEDURE:  Satisfactory interscalene block by anesthesiologist, satisfied general anesthesia, pneumatic tourniquet at left upper extremity with the arm Esmarched out nonsterilely and tourniquet inflated to 250 mmHg.  The left upper extremity was then prepped from just past the elbow to fingertips and draped in a sterile field.  Time- out was performed.  I first marked out my usual carpal tunnel incision crossing at the base of thenar eminence and obliquely over the flexor crease of the wrist and the distal forearm.  I had originally planned to harvest the palmaris longus tendon by elongating this incision proximally, but her  tendon was so small that I felt that there would not be enough volume of tissue to warrant the additional incisions proximal ward.  Consequently, we left the palmaris longus tendon alone and identified the median nerve at the wrist and then decompressed the skin, subcutaneous tissue, fascia into the distal palm.  There was some nerve impingement all way to the distal palmar crease.  Potential bleeders were coagulated with bipolar cautery.  When this had been completed, I irrigated the wound well with sterile saline and closed the skin and subcutaneous tissue only with interrupted 4-0 nylon mattress sutures.  I then made a second incision along the dorsal radial aspect of the first metacarpal and proximal ward.  I separated out with the natural interval, the thenar muscles from the dorsal muscles and worked my way down to the capsule of the carpometacarpal joint.  This was identified by its anatomy and also by motion on the metacarpal.  After carefully isolating the soft tissue away from the base of the metacarpal with sharp dissection and small periosteal elevator, I placed a baby Hohmann retractors dorsally and volarward, and under direct visualization, used a microsaw to cut the first metatarsal at the metaphyseal flare and removed it in somewhat piecemeal fashion with one major fragment and several smaller fragments.  I then demarcated  the distal portion of the greater multangular in the same fashion and also removed this in piecemeal fashion with the combination of microsaw, small osteotomes and small rongeur.  This left a nice open bed.  I then placed two 0.045 smooth K-wires from proximal to distal out through the ulnar side of the thumb and then cephalad through the remaining greater multangular placing the thumb and what I thought was a desirable functional position.  I then released the tourniquet to make sure that the thumb was nice and pink.  Following this, I irrigated the  wound well with sterile saline and placed a small piece of Gelfoam in the gap by bone resection.  I then reapproximated the dorsal and thenar eminence muscles with interrupted 3-0 Vicryl and skin with interrupted 4-0 nylon mattress sutures.  Betadine, Adaptic, and a well-padded thumb spica splint cast were applied.  She tolerated the procedure well, was taken to the recovery room in satisfactory condition with no known complications.          ______________________________ Marlowe Kays, M.D.     JA/MEDQ  D:  10/30/2012  T:  10/31/2012  Job:  [id number]

## 2012-11-06 ENCOUNTER — Other Ambulatory Visit: Payer: Self-pay | Admitting: Pulmonary Disease

## 2012-11-20 ENCOUNTER — Other Ambulatory Visit: Payer: Self-pay | Admitting: Ophthalmology

## 2013-04-01 ENCOUNTER — Encounter: Payer: Self-pay | Admitting: Pulmonary Disease

## 2013-04-01 ENCOUNTER — Other Ambulatory Visit (INDEPENDENT_AMBULATORY_CARE_PROVIDER_SITE_OTHER): Payer: Medicare Other

## 2013-04-01 ENCOUNTER — Ambulatory Visit (INDEPENDENT_AMBULATORY_CARE_PROVIDER_SITE_OTHER): Payer: Self-pay | Admitting: Pulmonary Disease

## 2013-04-01 VITALS — BP 134/62 | HR 64 | Temp 97.9°F | Ht 60.0 in | Wt 139.0 lb

## 2013-04-01 DIAGNOSIS — K219 Gastro-esophageal reflux disease without esophagitis: Secondary | ICD-10-CM

## 2013-04-01 DIAGNOSIS — I10 Essential (primary) hypertension: Secondary | ICD-10-CM

## 2013-04-01 DIAGNOSIS — M8580 Other specified disorders of bone density and structure, unspecified site: Secondary | ICD-10-CM | POA: Insufficient documentation

## 2013-04-01 DIAGNOSIS — M1990 Unspecified osteoarthritis, unspecified site: Secondary | ICD-10-CM

## 2013-04-01 DIAGNOSIS — K5730 Diverticulosis of large intestine without perforation or abscess without bleeding: Secondary | ICD-10-CM

## 2013-04-01 DIAGNOSIS — F411 Generalized anxiety disorder: Secondary | ICD-10-CM

## 2013-04-01 DIAGNOSIS — E042 Nontoxic multinodular goiter: Secondary | ICD-10-CM | POA: Insufficient documentation

## 2013-04-01 DIAGNOSIS — M899 Disorder of bone, unspecified: Secondary | ICD-10-CM

## 2013-04-01 DIAGNOSIS — I872 Venous insufficiency (chronic) (peripheral): Secondary | ICD-10-CM

## 2013-04-01 DIAGNOSIS — E7800 Pure hypercholesterolemia, unspecified: Secondary | ICD-10-CM

## 2013-04-01 DIAGNOSIS — N200 Calculus of kidney: Secondary | ICD-10-CM

## 2013-04-01 LAB — LDL CHOLESTEROL, DIRECT: Direct LDL: 160.8 mg/dL

## 2013-04-01 LAB — LIPID PANEL
Cholesterol: 262 mg/dL — ABNORMAL HIGH (ref 0–200)
HDL: 47.9 mg/dL
Total CHOL/HDL Ratio: 5
Triglycerides: 279 mg/dL — ABNORMAL HIGH (ref 0.0–149.0)
VLDL: 55.8 mg/dL — ABNORMAL HIGH (ref 0.0–40.0)

## 2013-04-01 NOTE — Patient Instructions (Addendum)
Today we updated your med list in our EPIC system...    Continue your current medications the same...  Today we rechecked your FASTING lipid profile off the Simvastatin on diet alone...    We will contact you w/ the results when available...   Keep up the good work w/ diet & exercise...  Call for any questions...  Let's plan a follow up visit in 74mo, sooner if needed for problems.Marland KitchenMarland Kitchen

## 2013-04-01 NOTE — Progress Notes (Signed)
Subjective:    Patient ID: Mary [name], female    DOB: 1940-09-09, 72 y.o.   MRN: [id number]  HPI 72 y/o WF here for a follow up visit, she is Mary [name]'s sister... she has HBP, Hypercholesterolemia, & Osteopenia- on meds below...   ~  October 06, 2011:  58mo ROV & she reports a good interval but had recent bronchitic exac & [facility] (given 2 [facility] & cough syrup- didn't bring bottles); now improved> f/u CXR today shows clear lungs, the 8mm RML nodule seen on CXR cannot be seen on the current film & we discussed f/u in 58mo w/ another CT at that time...  She is due for Fasting blood work today> see below; she had 2012 Flu vaccine 9/12, & requests refill prescriptions for 90d supplies...  ~  April 03, 2012:  58mo ROV & Mary [name] is feeling well- no new complaints or concerns; specifically she denies cough, sput, hemoptysis, SOB, chest discomfort, etc... Incidental 8-59mm right base nodule seen on CTAbd 5/12 as part of w/u for kidney stone; CTChest confirmed & showed 4mm LLL nodule as well; she is low risk, non-smoker & interval CXR was clear- due for 12mo f/u CT at this time...    We reviewed prob list, meds, xrays and labs> see below for updates >> BMet 7/13 is wnl... CT Chest 8/13 showed ==> right lung nodule measures 8mm, no new nodules or adenopathy, bilat thyroid nodules noted, simple cyst in liver...   ~  October 02, 2012:  58mo ROV & Mary [name] had another kidney stone 11/13, this one on the right w/ hydroneph, & required cysto, laser rx (DrTannenbaum), stent, removed, now improved... We reviewed the following medical problems during today's office visit >>     Pulm nodule> 9mm RLL nodule seen on CTAbd done for eval renal stone 5/12; never smoked, no hx lung dis or pulm problems; CXR is clear & she is asymptomatic; f/u CTChest 8/13 showed 8mm nodule, no new lesions or adenopathy...    HBP> on ASA81, Ziac5, Lisin20; BP= 150/82 & she describes prob that her sister had w/ Lisinopril;  we decided to change this to LOSARTAN 100mg /d & follow BP.    Ven insuffic> on low sodium diet, elevation, support hose...    Chol> on Simva20; she is very specific that she wants to stop Statin Rx & control as best she can w/ diet alone; Mother died from liver cancer & several family members w/ liver dis...     Borderline BS> she is on low carb diet & wt stable ~143#...    GI- GERD, Divertics> on OTC Prilosec as needed; she is asymptomatic w/o abd pain, n/v, c/d, blood seen; last colon was 2011 & neg...    Kidney stones> off all urology meds now; she had another right kid stone 11/13 req cysto, laser rx, stent, etc- now resolved...    DJD, DDD, CTS> off prev Ultracet rx; she is c/o left shoulder pain w/ decr ROM & encouraged to f/u w/ ortho, take OTC Ibuprofen prn...    Osteopenia> on Calcium, MVI, VitD1000; BMD 2/14 shows TScores +0.4 in spine & -1.8 in right FemNeck; VitD=63; Rec- continue same & off Bisphos for now.    Anxiety> she has not required anxiolytic meds... We reviewed prob list, meds, xrays and labs> see below for updates >> she had the flu shot 10/13; requests refills all meds for 90d... LABS 1/14:  Chems- wnl;  CBC- wnl;  TSH=0.67;  VitD=63   ~  April 01, 2013:  107mo ROV & Mary [name] describes a good interval w/o new complaints or concerns;  We reviewed the following medical problems during today's office visit >>     Pulm nodule> 9mm RLL nodule seen on CTAbd done for eval renal stone 5/12; never smoked, no hx lung dis or pulm problems; CXR is clear & she is asymptomatic; f/u CTChest 8/13 showed 8mm nodule, no new lesions or adenopathy...    HBP> on ASA81, Ziac5, Losartan100; BP= 134/62 & she is tol Losar well; denies CP, palpit, SOB, edema, etc...     Ven insuffic> on low sodium diet, elevation, support hose...    Chol> off Simva20 on her insistance noting that Mother died from liver cancer & several family members w/ liver dis; FLP 7/14 on diet alone showed TChol 262, TG 279, HDL 48,  LDL 161; she is referred to Lipid CLinic!    Borderline BS> she is on low carb diet & wt stable ~140#...    GI- GERD, Divertics> on OTC Prilosec as needed; she is asymptomatic w/o abd pain, n/v, c/d, blood seen; last colon was 2011 & neg...    Kidney stones> off all urology meds now; she had another right kid stone 11/13 req cysto, laser rx, stent, etc- now resolved; followed by DrGrapey    DJD, DDD, CTS> off prev Ultracet rx; she is c/o left shoulder pain w/ decr ROM & encouraged to f/u w/ ortho, take OTC Ibuprofen prn; s/p carpel tunnel release 2/14 by Mary [name]...    Osteopenia> on Calcium, MVI, VitD1000; BMD 2/14 shows TScores +0.4 in spine & -1.8 in right FemNeck; VitD=63; Rec- continue same & off Bisphos for now.    Anxiety> she has not required anxiolytic meds... We reviewed prob list, meds, xrays and labs> see below for updates >>  LABS 7/14:  FLP- not at goals on diet alone!            Problem List:  Hx of SINUSITIS (ICD-473.9)                                                  Hx of BRONCHITIS (ICD-490) >> ~  1/13:  She reports recent bronchitic exac & [facility]> treated w/ 2 antibiotics ?which ones, and cough syrup; symptoms resolved & back to baseline...  PULMONARY NODULE >>  ~  6/12:  Discovered during the 5/12 ER eval for kidney stone; she had CT Abd that showed an incidental 9mm nodule right lung base (?RML); she is a never smoker, no hx lung dis or pulm problems other than the occas bout of bronchitis; last CXR was 1/10= clear, NAD; she denies cough, sputum, hemoptysis, SOB, etc; she exercises by walking (no DOE) & cares for her blind 2 y/o grandson;  We discussed further eval w/ CT Chest. ~  7/12:  CT Chest w/ contrast confirms 8mm RML nodule, 4mm LLL nodule, no adenopathy, heterogeneous thyroid, DJD spine... ~  2/13:  CXR is clear, no infiltrates, nodules, adenopathy, etc... ~  CT Chest 8/13 showed ==> right lung nodule measures 8mm, no new nodules or adenopathy,  bilat thyroid nodules noted, simple cyst in liver...   HYPERTENSION (ICD-401.9) - on ASA 81mg /d,  LISINOPRIL 20mg /d, & ZIAC 5mg /d...  ~  6/12:  BP today= 144/76, takes med regularly, tolerates well> denies HA, fatigue, visual  changes, CP, palpit, dizziness, syncope, dyspnea, edema, etc... ~  2/13:  BP= 140/80 & she remains asymptomatic... ~  7/13:  BP= 142/70 & she denies CP, palpit, SOB, edema, etc... ~  11/13:  EKG showed NSR, rate84, min NSSTTWA, NAD... ~  1/14: on ASA81, Ziac5, Lisin20; BP= 150/82 & she describes prob that her sister had w/ Lisinopril; we decided to change this to LOSARTAN 100mg /d & follow BP.  VENOUS INSUFFICIENCY (ICD-459.81) >> she knows to elim sodium, elev legs, & wear support hose...  HYPERCHOLESTEROLEMIA (ICD-272.0) - prev on SIMVASTATIN 20mg /d & tol well, but she refused to continue Rx due to fam hx liver cancer & liver disease... ~  FLP 2/08 on Lip10 showed TChol 188, TG 176, HDL 63, LDL 90 ~  FLP 1/10 on Simva20 showed TChol 188, TG 108, HDL 59, LDL 107 ~  FLP 2/11 on Simva20 showed TChol 168, TG 181, HDL 53, LDL 79 ~  FLP 2/12 on Simva20 showed TChol 164, TG 120, HDL 46, LDL 94 ~  FLP 2/13 on Simva20 showed TChol 191, TG 177, HDL 52, LDL 103... She refuses to continue the Simva due to +FamHx liver dis... ~  FLP 7/14 on diet alone showed TChol 262, TG 279, HDL 48, LDL 161.Marland Kitchen She is referred to the Lipid Clinic...  THYROID NODULES >>  ~  CT Chest 7/12 & 8/13 showed incidental bilat thyroid nodules (no changes serially)...  GERD (ICD-530.81) - she uses OTC Prilosec as needed... denies nausea, vomiting, heartburn, diarrhea, constipation, blood in stool, abdominal pain, swelling, gas...  DIVERTICULOSIS OF COLON (ICD-562.10) - she is essent asymptomatic... ~  colonoscopy 12/00 by DrStark was neg> f/u planned 42yrs. ~  colonoscopy 3/11 by DrStark showed mild divertics, otherw neg.  Hx of BREAST CYST (ICD-610.0) - benign breast cyst asp 1989, she gets yearly  mammogram at [facility].  KIDNEY STONES>>  ~  6/12:  she developed sudden right flank pain 01/27/11 w/ N & V, went to the ER where she was diagnosed w/ right ureteral stone; treated w/ pain meds, fluids, & Keflex for infection; she was already sched for incontinence surg by DrGrapey- so on 02/13/11 he did a cysto, retrograde pyelogram, holmium laser lithotripsy w/ basketing of the fragments, double J stent, AND retropubic suburethral sling;  She reports great result- stone ablated & incont resolved...  ~  7/13:  Pt reports that DrGrapey saw 2 sm stones in kidney but currently asymptomatic... ~  11/13: Mary [name] had another kidney stone 11/13, this one on the right w/ hydroneph, & required lithotripsy, stent, removed, now improved; CTAbd showed bilat non-obstructive stones in kidneys... ~  12/13: f/u by Urology s/p stent removal & Sonar showed the hydroneph has resolved; given stone prevention guidelines...  DEGENERATIVE JOINT DISEASE (ICD-715.90) - she notes left shoulder pain w/ decr ROM & asked to f/u w/ Ortho. Hx of DEGENERATIVE DISC DISEASE (ICD-722.6) - s/p lumbar laminectomy, no signif LBP now & walks some... she has DJD but not significantly limited and uses OTC meds Prn. CARPAL TUNNEL SYNDROME, BILATERAL (ICD-354.0) - she has seen Mary [name] in the past; uses wrist splints qHS- had right carpel tunnel release, & right rotator cuff surg... ~  2/14: s/p carpel tunnel release left hand by Mary [name]  OSTEOPOROSIS (ICD-733.00) - on Calcium, Vits, & Vit D; prev on Alendronate x yrs & placed on hold 2/12 for drug holiday... ~  BMD 12/07 showed TScores -0.8 to -1.9 ( sl better spine, sl worse hips)... rec- Alendronate 70mg /wk. ~  labs 2/08 w/ Vit D level = 34... rec to take Vit D 1000 u OTC daily... ~  BMD 1/10 on Alendronate showed TScores +0.2 in Spine, & -1.9 in right FemNeck... rec> continue Rx. ~  labs 1/10 showed Vit D level = 39... rec> continue 1000 u OTC daily. ~  2/12: she's been on  Alendronate for yrs & we discussed stopping this med for a "drug holiday"... ~  labs 2/12 showed Vit D level = 52... continue same; but we decided on a Bisphos drug holiday for the next yr. ~  Labs  2/13 showed Vit D level = 59... Continue same. ~  BMD 2/14 showed TScores +0.4 in Spine , and -1.8 in right FemNeck... As she is stable we will continue off Bisphos rx...  ANXIETY (ICD-300.00)  HEALTH MAINTANENCE = all up-to-date. ~  COLONOSCOPY:  done 3/11 by DrStark & neg x for divertics... ~  GYN:  DrHorvath - on Estradiol + Medroxyprogesterone... ~  MAMMOGRAM:  at [facility] yearly... ~  BMD:  done 1/10 here w/ OSTEOPENIA, TScores +0.2  to -1.9, sl better in spine & sl worse in hips. ~  VACCINATIONS:  she gets the yearly Flu vaccine; PNEUMOVAX 11/99 & 1/10 here,  TETANUS shot 11/99 & 1/10 here...  ~  NOTE:  she takes ACYCLOVIR 400Tid x5d as needed for HSV1 outbreaks...   Past Surgical History  Procedure Laterality Date  . Right rotator cuff surgery  04/2005    Dr. Leslee [name]  . Laminectomy and microdiscectomy lumbar spine  02/2002    Dr. Leslee [name]  . Holmium laser lithotripsy for right ureteral stone  6/12    by DrGrapey  . Retropubic suburethral sling for urinary incontinence  6/12     by DrGrapey  . Cystoscopy w/ ureteral stent placement  07/14/2012    Procedure: CYSTOSCOPY WITH RETROGRADE PYELOGRAM/URETERAL STENT PLACEMENT;  Surgeon: Kathi Ludwig, MD;  Location: [facility];  Service: Urology;  Laterality: Right;  . Holmium laser application  07/14/2012    Procedure: HOLMIUM LASER APPLICATION;  Surgeon: Kathi Ludwig, MD;  Location: [facility];  Service: Urology;  Laterality: Right;  . Carpal tunnel release Right 2009  . Carpal tunnel release Left 10/30/2012    Procedure: CARPAL TUNNEL RELEASE;  Surgeon: Drucilla Schmidt, MD;  Location: [facility];  Service: Orthopedics;  Laterality: Left;  . Finger arthroplasty Left 10/30/2012    Procedure: thumb ARTHROPLASTY;   Surgeon: Drucilla Schmidt, MD;  Location: [facility];  Service: Orthopedics;  Laterality: Left;  carpometacarpal arthroplasty thumb    Outpatient Encounter Prescriptions as of 04/01/2013  Medication Sig Dispense Refill  . aspirin 81 MG tablet Take 81 mg by mouth daily.        . bisoprolol-hydrochlorothiazide (ZIAC) 5-6.25 MG per tablet Take 1 tablet by mouth daily.  90 tablet  3  . Calcium Carbonate-Vit D-Min (CALTRATE 600+D PLUS) 600-400 MG-UNIT per tablet Take 1 tablet by mouth daily.        . cholecalciferol (VITAMIN D) 1000 UNITS tablet Take 1,000 Units by mouth daily.        Marland Kitchen lisinopril (PRINIVIL,ZESTRIL) 20 MG tablet TAKE 1 TABLET EVERY DAY  90 tablet  3  . losartan (COZAAR) 100 MG tablet Take 1 tablet (100 mg total) by mouth daily.  90 tablet  3  . methocarbamol (ROBAXIN) 500 MG tablet Take 1 tablet (500 mg total) by mouth every 6 (six) hours as needed (spasms).  30 tablet  3  .  Multiple Vitamins-Minerals (WOMENS MULTIVITAMIN PLUS) TABS Take 1 tablet by mouth daily.        Marland Kitchen oxyCODONE-acetaminophen (ROXICET) 5-325 MG per tablet Take 1-2 tablets by mouth every 4 (four) hours as needed for pain.  40 tablet  0  . vitamin C (ASCORBIC ACID) 500 MG tablet Take 500 mg by mouth daily.        . [DISCONTINUED] bisoprolol-hydrochlorothiazide (ZIAC) 5-6.25 MG per tablet TAKE 1 TABLET EVERY DAY  90 tablet  3  . [DISCONTINUED] bisoprolol-hydrochlorothiazide (ZIAC) 5-6.25 MG per tablet TAKE 1 TABLET EVERY DAY  90 tablet  3  . [DISCONTINUED] cephALEXin (KEFLEX) 500 MG capsule Take 1 capsule (500 mg total) by mouth 4 (four) times daily.  4 capsule  0   No facility-administered encounter medications on file as of 04/01/2013.    Allergies  Allergen Reactions  . Sulfonamide Derivatives     REACTION: nausea    Review of Systems       The patient denies fever, chills, sweats, anorexia, fatigue, weakness, malaise, weight loss, sleep disorder, blurring, diplopia, eye irritation, eye  discharge, vision loss, eye pain, photophobia, earache, ear discharge, tinnitus, decreased hearing, nasal congestion, nosebleeds, sore throat, hoarseness, chest pain, palpitations, syncope, dyspnea on exertion, orthopnea, PND, peripheral edema, cough, dyspnea at rest, excessive sputum, hemoptysis, wheezing, pleurisy, nausea, vomiting, diarrhea, constipation, change in bowel habits, abdominal pain, melena, hematochezia, jaundice, gas/bloating, indigestion/heartburn, dysphagia, odynophagia, dysuria, hematuria, urinary frequency, urinary hesitancy, nocturia, back pain, joint pain, joint swelling, muscle cramps, muscle weakness, stiffness, sciatica, restless legs, leg pain at night, leg pain with exertion, rash, itching, dryness, suspicious lesions, paralysis, paresthesias, seizures, tremors, vertigo, transient blindness, frequent falls, frequent headaches, difficulty walking, depression, anxiety, memory loss, confusion, cold intolerance, heat intolerance, polydipsia, polyphagia, polyuria, unusual weight change, abnormal bruising, bleeding, enlarged lymph nodes, urticaria, allergic rash, hay fever, and recurrent infections.     Objective:   Physical Exam     WD, WN, 72 y/o WF in NAD... GENERAL:  Alert & oriented; pleasant & cooperative... HEENT:  [place]/AT, EOM-wnl, PERRLA, Fundi-benign, EACs-clear, TMs-wnl, NOSE-clear, THROAT-clear & wnl. NECK:  Supple w/ full ROM; no JVD; normal carotid impulses w/o bruits; no thyromegaly or nodules palpated; no lymphadenopathy. CHEST:  Clear to P & A; without wheezes/ rales/ or rhonchi. HEART:  Regular Rhythm; without murmurs/ rubs/ or gallops. ABDOMEN:  Soft & nontender; normal bowel sounds; no organomegaly or masses detected. EXT: without deformities, mild arthritic changes; no varicose veins/ venous insuffic/ or edema. NEURO:  CN's intact; motor testing normal; sensory testing normal; gait normal & balance OK. DERM:  No lesions noted; no rash etc...  RADIOLOGY DATA:   Reviewed in the EPIC EMR & discussed w/ the patient...  LABORATORY DATA:  Reviewed in the EPIC EMR & discussed w/ the patient...   Assessment & Plan:    Kidney Stones> Eval & Rx from [facility]...  PULM NODULE>   8mm RML nodule seen on CT scan, not visible on CXRs to date; she is low risk (never smoker etc) & f/u CT Chest ==> no change in 8mm nodule & Radiology rec f/u CT in 18-70mo...   Hx Sinusitis & Bronchitis>  Prev [facility]ac [facility] w/ ?antibiotics & cough syrup- resolved...  HBP>  Controlled on meds...  CHOL>  She has refused Simva20 for her chol due to fam hx liver dis; I rec refer to Lipid Clinic...  GI>  GERD, Divertics>  Stable & up to date...  DJD, Osteopenia>  Stable &  doing well; BMD showed stable TScores & we decided to leave the Bisphos off for another cycle...  Anxiety>  She is handling the stress well, does not request anxiolytics...   Patient's Medications  New Prescriptions   SIMVASTATIN (ZOCOR) 20 MG TABLET    Take 1 tablet (20 mg total) by mouth at bedtime.  Previous Medications   ASPIRIN 81 MG TABLET    Take 81 mg by mouth daily.     BISOPROLOL-HYDROCHLOROTHIAZIDE (ZIAC) 5-6.25 MG PER TABLET    Take 1 tablet by mouth daily.   CALCIUM CARBONATE-VIT D-MIN (CALTRATE 600+D PLUS) 600-400 MG-UNIT PER TABLET    Take 1 tablet by mouth daily.     CHOLECALCIFEROL (VITAMIN D) 1000 UNITS TABLET    Take 1,000 Units by mouth daily.     LOSARTAN (COZAAR) 100 MG TABLET    Take 1 tablet (100 mg total) by mouth daily.   MULTIPLE VITAMINS-MINERALS (WOMENS MULTIVITAMIN PLUS) TABS    Take 1 tablet by mouth daily.     VITAMIN C (ASCORBIC ACID) 500 MG TABLET    Take 500 mg by mouth daily.    Modified Medications   No medications on file  Discontinued Medications   BISOPROLOL-HYDROCHLOROTHIAZIDE (ZIAC) 5-6.25 MG PER TABLET    TAKE 1 TABLET EVERY DAY   BISOPROLOL-HYDROCHLOROTHIAZIDE (ZIAC) 5-6.25 MG PER TABLET    TAKE 1 TABLET EVERY DAY   CEPHALEXIN  (KEFLEX) 500 MG CAPSULE    Take 1 capsule (500 mg total) by mouth 4 (four) times daily.   LISINOPRIL (PRINIVIL,ZESTRIL) 20 MG TABLET    TAKE 1 TABLET EVERY DAY   METHOCARBAMOL (ROBAXIN) 500 MG TABLET    Take 1 tablet (500 mg total) by mouth every 6 (six) hours as needed (spasms).   OXYCODONE-ACETAMINOPHEN (ROXICET) 5-325 MG PER TABLET    Take 1-2 tablets by mouth every 4 (four) hours as needed for pain.

## 2013-04-03 ENCOUNTER — Telehealth: Payer: Self-pay | Admitting: Pulmonary Disease

## 2013-04-03 DIAGNOSIS — E7800 Pure hypercholesterolemia, unspecified: Secondary | ICD-10-CM

## 2013-04-03 NOTE — Telephone Encounter (Signed)
Called and spoke with pt and she is aware of lab results per SN.  Pt is aware that order has been placed for her to be seen in the lipid clinic for further eval.

## 2013-04-08 ENCOUNTER — Ambulatory Visit: Payer: Medicare Other | Admitting: Pharmacist

## 2013-04-14 ENCOUNTER — Ambulatory Visit (INDEPENDENT_AMBULATORY_CARE_PROVIDER_SITE_OTHER): Payer: Medicare Other | Admitting: Pharmacist

## 2013-04-14 VITALS — Wt 140.2 lb

## 2013-04-14 DIAGNOSIS — E7800 Pure hypercholesterolemia, unspecified: Secondary | ICD-10-CM

## 2013-04-14 MED ORDER — SIMVASTATIN 20 MG PO TABS: 20.0000 mg | ORAL_TABLET | Freq: Every day | ORAL | Status: DC

## 2013-04-14 NOTE — Patient Instructions (Addendum)
Restart simvastatin 20mg    Recheck labs in 3 months.

## 2013-04-17 NOTE — Assessment & Plan Note (Addendum)
Pt's cholesterol increased while off statin.  TC- 262, TG- 279, HDL- 48, LDL- 161.  10-year risk 15%.  Based on risk, pt would benefit from statin.  She is willing to restart.  Plan to recheck labs in 3 months.

## 2013-04-17 NOTE — Progress Notes (Signed)
HPI  Mary [name] is a 72 yo patient referred to Lipid clinic by Dr. Kriste Basque.  She has a PMH significant for HTN and hyperlipidemia.  No CAD or DM.  Family history is noncontributory.  She does not smoke or drink.  In the past, her cholesterol has been well controlled with simvastatin.  She did not have any problems with taking it but wanted to try diet and exercise rather than medications.    Diet Breakfast- Honey nut cheerios, english muffin with cream cheese, apple, 2% milk, coffee Lunch and Dinner- no fried foods, salads with chicken , occasional boiled shrimp, vegetables.  She likes to go and [employer] because she lives alone and does not like to cook for herself.  She tries to avoid sweets but if she does eat any, she will share with her sister.   Snacks- gummie snacks, 1/2 peanut butter sandwich with wheat bread, fruit (bananas, apples, pears, grapes).   Exercise She tries to walk about 1/2 mile 3-4 days a week.  She also keeps her great grandson (18 years old) 2 days of the week.  Current Outpatient Prescriptions on File Prior to Visit  Medication Sig Dispense Refill  . aspirin 81 MG tablet Take 81 mg by mouth daily.        . bisoprolol-hydrochlorothiazide (ZIAC) 5-6.25 MG per tablet Take 1 tablet by mouth daily.  90 tablet  3  . Calcium Carbonate-Vit D-Min (CALTRATE 600+D PLUS) 600-400 MG-UNIT per tablet Take 1 tablet by mouth daily.        . cholecalciferol (VITAMIN D) 1000 UNITS tablet Take 1,000 Units by mouth daily.        Marland Kitchen losartan (COZAAR) 100 MG tablet Take 1 tablet (100 mg total) by mouth daily.  90 tablet  3  . Multiple Vitamins-Minerals (WOMENS MULTIVITAMIN PLUS) TABS Take 1 tablet by mouth daily.        . vitamin C (ASCORBIC ACID) 500 MG tablet Take 500 mg by mouth daily.         No current facility-administered medications on file prior to visit.    Allergies  Allergen Reactions  . Sulfonamide Derivatives     REACTION: nausea

## 2013-04-18 ENCOUNTER — Ambulatory Visit: Payer: Self-pay | Admitting: Pharmacist

## 2013-06-04 LAB — HM MAMMOGRAPHY

## 2013-06-30 ENCOUNTER — Other Ambulatory Visit: Payer: Self-pay | Admitting: Pulmonary Disease

## 2013-07-09 ENCOUNTER — Other Ambulatory Visit (INDEPENDENT_AMBULATORY_CARE_PROVIDER_SITE_OTHER): Payer: Medicare Other

## 2013-07-09 DIAGNOSIS — E7800 Pure hypercholesterolemia, unspecified: Secondary | ICD-10-CM

## 2013-07-09 LAB — HEPATIC FUNCTION PANEL
Alkaline Phosphatase: 79 U/L (ref 39–117)
Bilirubin, Direct: 0.1 mg/dL (ref 0.0–0.3)
Total Bilirubin: 0.7 mg/dL (ref 0.3–1.2)

## 2013-07-09 LAB — LIPID PANEL
Cholesterol: 192 mg/dL (ref 0–200)
LDL Cholesterol: 103 mg/dL — ABNORMAL HIGH (ref 0–99)
Total CHOL/HDL Ratio: 3
VLDL: 28.4 mg/dL (ref 0.0–40.0)

## 2013-07-11 ENCOUNTER — Telehealth: Payer: Self-pay | Admitting: Pulmonary Disease

## 2013-07-11 NOTE — Telephone Encounter (Signed)
Per SN on the lab results:   Back on Simva20 per Lipid Clinic; FLP much improved- continue same & I forwarded the results to [facility] and spoke with pt and she is aware of lab results per SN.

## 2013-07-16 ENCOUNTER — Ambulatory Visit (INDEPENDENT_AMBULATORY_CARE_PROVIDER_SITE_OTHER): Payer: Medicare Other | Admitting: Pharmacist

## 2013-07-16 VITALS — Wt 140.5 lb

## 2013-07-16 DIAGNOSIS — E7800 Pure hypercholesterolemia, unspecified: Secondary | ICD-10-CM

## 2013-07-16 NOTE — Patient Instructions (Signed)
Continue Simvastatin  Follow up with Dr. Kriste Basque.

## 2013-07-16 NOTE — Progress Notes (Signed)
HPI  Mary [name] is a 72 yo patient referred to Lipid clinic by Dr. Kriste Basque.  She has a PMH significant for HTN and hyperlipidemia.  No CAD or DM.  Family history is noncontributory.  She does not smoke or drink.  At last visit she was started back on simvastatin 20mg  daily.  She does not report any problems since restarting this in August.   Diet Breakfast- Honey nut cheerios, english muffin with cream cheese, apple, 2% milk, coffee Lunch and Dinner- no fried foods, salads with chicken , occasional boiled shrimp, vegetables.  She has started sharing the cooking responsibility with her sister to help avoid eating out.  Since doing this, she has lost about 10lbs.  Snacks- gummie snacks, 1/2 peanut butter sandwich with wheat bread, fruit (bananas, apples, pears, grapes).   Exercise She tries to walk about 1/2 mile 3-4 days a week.    Current Outpatient Prescriptions on File Prior to Visit  Medication Sig Dispense Refill  . acyclovir (ZOVIRAX) 400 MG tablet TAKE 1 TABLET THREE TIMES A DAY FOR 5 DAYS AS DIRECTED FOR FEVER BLISTERS  30 tablet  1  . aspirin 81 MG tablet Take 81 mg by mouth daily.        . bisoprolol-hydrochlorothiazide (ZIAC) 5-6.25 MG per tablet Take 1 tablet by mouth daily.  90 tablet  3  . Calcium Carbonate-Vit D-Min (CALTRATE 600+D PLUS) 600-400 MG-UNIT per tablet Take 1 tablet by mouth daily.        . cholecalciferol (VITAMIN D) 1000 UNITS tablet Take 1,000 Units by mouth daily.        Marland Kitchen losartan (COZAAR) 100 MG tablet Take 1 tablet (100 mg total) by mouth daily.  90 tablet  3  . Multiple Vitamins-Minerals (WOMENS MULTIVITAMIN PLUS) TABS Take 1 tablet by mouth daily.        . simvastatin (ZOCOR) 20 MG tablet Take 1 tablet (20 mg total) by mouth at bedtime.  30 tablet  3  . vitamin C (ASCORBIC ACID) 500 MG tablet Take 500 mg by mouth daily.         No current facility-administered medications on file prior to visit.    Allergies  Allergen Reactions  . Sulfonamide Derivatives      REACTION: nausea

## 2013-07-16 NOTE — Assessment & Plan Note (Signed)
Pt's cholesterol much improved since restarting simvastatin.  TC- 192, TG- 142, HDL- 60, LDL- 103 (goal<130).  LFTs are WNL.  Will continue simvastatin at this time.  Encouraged her to continue healthy lifestyle choices. Will have her follow up with Dr. Kriste Basque for futher management.

## 2013-08-06 ENCOUNTER — Other Ambulatory Visit: Payer: Self-pay | Admitting: Pulmonary Disease

## 2013-08-31 ENCOUNTER — Other Ambulatory Visit: Payer: Self-pay | Admitting: Pulmonary Disease

## 2013-09-24 ENCOUNTER — Other Ambulatory Visit: Payer: Self-pay | Admitting: Pulmonary Disease

## 2013-10-06 ENCOUNTER — Ambulatory Visit (INDEPENDENT_AMBULATORY_CARE_PROVIDER_SITE_OTHER): Payer: Medicare HMO | Admitting: Pulmonary Disease

## 2013-10-06 ENCOUNTER — Ambulatory Visit (INDEPENDENT_AMBULATORY_CARE_PROVIDER_SITE_OTHER)
Admission: RE | Admit: 2013-10-06 | Discharge: 2013-10-06 | Disposition: A | Discharge status: Home / Self Care | Payer: Medicare HMO | Source: Ambulatory Visit | Attending: Pulmonary Disease | Admitting: Pulmonary Disease

## 2013-10-06 ENCOUNTER — Encounter: Payer: Self-pay | Admitting: Pulmonary Disease

## 2013-10-06 ENCOUNTER — Other Ambulatory Visit (INDEPENDENT_AMBULATORY_CARE_PROVIDER_SITE_OTHER): Payer: Medicare HMO

## 2013-10-06 VITALS — BP 122/88 | HR 62 | Temp 97.8°F | Ht 60.0 in | Wt 141.0 lb

## 2013-10-06 DIAGNOSIS — R7303 Prediabetes: Secondary | ICD-10-CM

## 2013-10-06 DIAGNOSIS — K219 Gastro-esophageal reflux disease without esophagitis: Secondary | ICD-10-CM

## 2013-10-06 DIAGNOSIS — R7309 Other abnormal glucose: Secondary | ICD-10-CM

## 2013-10-06 DIAGNOSIS — I10 Essential (primary) hypertension: Secondary | ICD-10-CM

## 2013-10-06 DIAGNOSIS — F411 Generalized anxiety disorder: Secondary | ICD-10-CM

## 2013-10-06 DIAGNOSIS — E7800 Pure hypercholesterolemia, unspecified: Secondary | ICD-10-CM

## 2013-10-06 DIAGNOSIS — I872 Venous insufficiency (chronic) (peripheral): Secondary | ICD-10-CM

## 2013-10-06 DIAGNOSIS — N200 Calculus of kidney: Secondary | ICD-10-CM

## 2013-10-06 DIAGNOSIS — K5730 Diverticulosis of large intestine without perforation or abscess without bleeding: Secondary | ICD-10-CM

## 2013-10-06 DIAGNOSIS — M8580 Other specified disorders of bone density and structure, unspecified site: Secondary | ICD-10-CM

## 2013-10-06 DIAGNOSIS — E042 Nontoxic multinodular goiter: Secondary | ICD-10-CM

## 2013-10-06 DIAGNOSIS — R911 Solitary pulmonary nodule: Secondary | ICD-10-CM

## 2013-10-06 DIAGNOSIS — E119 Type 2 diabetes mellitus without complications: Secondary | ICD-10-CM | POA: Insufficient documentation

## 2013-10-06 DIAGNOSIS — M1990 Unspecified osteoarthritis, unspecified site: Secondary | ICD-10-CM

## 2013-10-06 LAB — CBC WITH DIFFERENTIAL/PLATELET
BASOS ABS: 0.1 10*3/uL (ref 0.0–0.1)
BASOS PCT: 0.8 % (ref 0.0–3.0)
Eosinophils Absolute: 0.2 10*3/uL (ref 0.0–0.7)
Eosinophils Relative: 2.4 % (ref 0.0–5.0)
HCT: 45.5 % (ref 36.0–46.0)
HEMOGLOBIN: 14.9 g/dL (ref 12.0–15.0)
LYMPHS PCT: 27.1 % (ref 12.0–46.0)
Lymphs Abs: 2.7 10*3/uL (ref 0.7–4.0)
MCHC: 32.8 g/dL (ref 30.0–36.0)
MCV: 93.8 fl (ref 78.0–100.0)
MONOS PCT: 6.8 % (ref 3.0–12.0)
Monocytes Absolute: 0.7 10*3/uL (ref 0.1–1.0)
NEUTROS ABS: 6.4 10*3/uL (ref 1.4–7.7)
Neutrophils Relative %: 62.9 % (ref 43.0–77.0)
Platelets: 264 10*3/uL (ref 150.0–400.0)
RBC: 4.85 Mil/uL (ref 3.87–5.11)
RDW: 12.9 % (ref 11.5–14.6)
WBC: 10.1 10*3/uL (ref 4.5–10.5)

## 2013-10-06 LAB — BASIC METABOLIC PANEL
BUN: 13 mg/dL (ref 6–23)
CALCIUM: 9.8 mg/dL (ref 8.4–10.5)
CO2: 27 mEq/L (ref 19–32)
Chloride: 104 mEq/L (ref 96–112)
Creatinine, Ser: 0.9 mg/dL (ref 0.4–1.2)
GFR: 67.27 mL/min (ref >60.00)
Glucose, Bld: 99 mg/dL (ref 70–99)
Potassium: 4 mEq/L (ref 3.5–5.1)
SODIUM: 139 meq/L (ref 135–145)

## 2013-10-06 LAB — TSH: TSH: 0.67 u[IU]/mL (ref 0.35–5.50)

## 2013-10-06 LAB — HEMOGLOBIN A1C: Hgb A1c MFr Bld: 6.3 % (ref 4.6–6.5)

## 2013-10-06 NOTE — Progress Notes (Signed)
Subjective:    Patient ID: Mary [name], female    DOB: [birth year], 72 y.o.   MRN: [id number]  HPI 73 y/o WF here for a follow up visit, she is Mary [name]'s sister... she has HBP, Hypercholesterolemia, & Osteopenia- on meds below...   ~  April 03, 2012:  34mo ROV & Mary [name] is feeling well- no new complaints or concerns; specifically she denies cough, sput, hemoptysis, SOB, chest discomfort, etc... Incidental 8-63mm right base nodule seen on CTAbd 5/12 as part of w/u for kidney stone; CTChest confirmed & showed 54mm LLL nodule as well; she is low risk, non-smoker & interval CXR was clear- due for 26mo f/u CT at this time...    We reviewed prob list, meds, xrays and labs> see below for updates >> BMet 7/13 is wnl... CT Chest 8/13 showed ==> right lung nodule measures 51mm, no new nodules or adenopathy, bilat thyroid nodules noted, simple cyst in liver...   ~  October 02, 2012:  8mo ROV & Mary [name] had another kidney stone 11/13, this one on the right w/ hydroneph, & required cysto, laser rx (DrTannenbaum), stent, removed, now improved... We reviewed the following medical problems during today's office visit >>     Pulm nodule> 73mm RLL nodule seen on CTAbd done for eval renal stone 5/12; never smoked, no hx lung dis or pulm problems; CXR is clear & she is asymptomatic; f/u CTChest 8/13 showed 51mm nodule, no new lesions or adenopathy...    HBP> on ASA81, Ziac5, Lisin20; BP= 150/82 & she describes prob that her sister had w/ Lisinopril; we decided to change this to LOSARTAN 100mg /d & follow BP.    Ven insuffic> on low sodium diet, elevation, support hose...    Chol> on Simva20; she is very specific that she wants to stop Statin Rx & control as best she can w/ diet alone; Mother died from liver cancer & several family members w/ liver dis...     Borderline BS> she is on low carb diet & wt stable ~143#...    GI- GERD, Divertics> on OTC Prilosec as needed; she is asymptomatic w/o abd pain, n/v, c/d, blood  seen; last colon was 2011 & neg...    Kidney stones> off all urology meds now; she had another right kid stone 11/13 req cysto, laser rx, stent, etc- now resolved...    DJD, DDD, CTS> off prev Ultracet rx; she is c/o left shoulder pain w/ decr ROM & encouraged to f/u w/ ortho, take OTC Ibuprofen prn...    Osteopenia> on Calcium, MVI, VitD1000; BMD 2/14 shows TScores +0.4 in spine & -1.8 in right FemNeck; VitD=63; Rec- continue same & off Bisphos for now.    Anxiety> she has not required anxiolytic meds... We reviewed prob list, meds, xrays and labs> see below for updates >> she had the flu shot 10/13; requests refills all meds for 90d... LABS 1/14:  Chems- wnl;  CBC- wnl;  TSH=0.67;  VitD=63   ~  April 01, 2013:  55mo ROV & Mary [name] describes a good interval w/o new complaints or concerns;  We reviewed the following medical problems during today's office visit >>     Pulm nodule> 44mm RLL nodule seen on CTAbd done for eval renal stone 5/12; never smoked, no hx lung dis or pulm problems; CXR is clear & she is asymptomatic; f/u CTChest 8/13 showed 22mm nodule, no new lesions or adenopathy...    HBP> on ASA81, Ziac5, Losartan100; BP= 134/62 & she is tol  Losar well; denies CP, palpit, SOB, edema, etc...     Ven insuffic> on low sodium diet, elevation, support hose...    Chol> off Simva20 on her insistance noting that Mother died from liver cancer & several family members w/ liver dis; FLP 7/14 on diet alone showed TChol 262, TG 279, HDL 48, LDL 161; she is referred to [facility]!    Borderline BS> she is on low carb diet & wt stable ~140#...    GI- GERD, Divertics> on OTC Prilosec as needed; she is asymptomatic w/o abd pain, n/v, c/d, blood seen; last colon was 2011 & neg...    Kidney stones> off all urology meds now; she had another right kid stone 11/13 req cysto, laser rx, stent, etc- now resolved; followed by DrGrapey    DJD, DDD, CTS> off prev Ultracet rx; she is c/o left shoulder pain w/ decr ROM &  encouraged to f/u w/ ortho, take OTC Ibuprofen prn; s/p carpel tunnel release 2/14 by DrAplington...    Osteopenia> on Calcium, MVI, VitD1000; BMD 2/14 shows TScores +0.4 in spine & -1.8 in right FemNeck; VitD=63; Rec- continue same & off Bisphos for now.    Anxiety> she has not required anxiolytic meds... We reviewed prob list, meds, xrays and labs> see below for updates >>  LABS 7/14:  FLP- not at goals on diet alone!    ~  October 06, 2013:  68mo ROV & Mary [name]  Reports doing well- no new complaints or concerns... We reviewed the following medical problems during today's office visit >>     Pulm nodule> 20mm RLL nodule seen on CTAbd done for eval renal stone 5/12; never smoked, no hx lung dis or pulm problems; CXR is clear & lesion not seen, she is asymptomatic; f/u CTChest 8/13 showed 68mm nodule, no new lesions or adenopathy...    HBP> on ASA81, Ziac5, Losartan100; BP= 128/88 & tol meds well; denies CP, palpit, SOB, edema, etc...     Ven insuffic> on low sodium diet, elevation, support hose...    Chol> back on Simva20 per Mary [name] & tol well- last FLP was 11/14 showed TChol 192, TG 142, HDL 61, LDL 103...    Borderline BS> she is on low carb diet & wt stable ~140#... Labs 2/15 showed BS= 99, A1c= 6.3    GI- GERD, Divertics> on OTC Prilosec as needed; she is asymptomatic w/o abd pain, n/v, c/d, blood seen; last colon was 2011 & neg...    Kidney stones> off all urology meds now; she had another right kid stone 11/13 req cysto, laser rx, stent, etc- now resolved; followed by DrGrapey    DJD, DDD, CTS> off prev Ultracet rx; she is c/o left shoulder pain w/ decr ROM & encouraged to f/u w/ ortho, take OTC Ibuprofen prn; s/p carpel tunnel release 2/14 by DrAplington...    Osteopenia> on Calcium, MVI, VitD1000; BMD 2/14 shows TScores +0.4 in spine & -1.8 in right FemNeck; VitD=63; Rec- continue same & wt bearing exercise, off Bisphos for now.    Anxiety> she has not required anxiolytic meds... We reviewed prob  list, meds, xrays and labs> see below for updates >>  CXR 2/15 showed norm hyeart size, clear lungs, NAD...  LABS 2/15:  Chems- ok w/ BS=99, A1c=6.3;  CBC- wnl;  TSH=0.67...            Problem List:  Hx of SINUSITIS (ICD-473.9)  Hx of BRONCHITIS (ICD-490) >> ~  1/13:  She reports recent bronchitic exac & [facility]> treated w/ 2 antibiotics ?which ones, and cough syrup; symptoms resolved & back to baseline...  PULMONARY NODULE >>  ~  6/12:  Discovered during the 5/12 ER eval for kidney stone; she had CT Abd that showed an incidental 38mm nodule right lung base (?RML); she is a never smoker, no hx lung dis or pulm problems other than the occas bout of bronchitis; last CXR was 1/10= clear, NAD; she denies cough, sputum, hemoptysis, SOB, etc; she exercises by walking (no DOE) & cares for her blind 2 y/o grandson;  We discussed further eval w/ CT Chest. ~  7/12:  CT Chest w/ contrast confirms 44mm RML nodule, 42mm LLL nodule, no adenopathy, heterogeneous thyroid, DJD spine... ~  2/13:  CXR is clear, no infiltrates, nodules, adenopathy, etc... ~  CT Chest 8/13 showed ==> right lung nodule measures 58mm, no new nodules or adenopathy, bilat thyroid nodules noted, simple cyst in liver...   HYPERTENSION (ICD-401.9) - on ASA 81mg /d,  LISINOPRIL 20mg /d, & ZIAC 5mg /d...  ~  6/12:  BP today= 144/76, takes med regularly, tolerates well> denies HA, fatigue, visual changes, CP, palpit, dizziness, syncope, dyspnea, edema, etc... ~  2/13:  BP= 140/80 & she remains asymptomatic... ~  7/13:  BP= 142/70 & she denies CP, palpit, SOB, edema, etc... ~  11/13:  EKG showed NSR, rate84, min NSSTTWA, NAD... ~  1/14: on ASA81, Ziac5, Lisin20; BP= 150/82 & she describes prob that her sister had w/ Lisinopril; we decided to change this to LOSARTAN 100mg /d & follow BP.  VENOUS INSUFFICIENCY (ICD-459.81) >> she knows to elim sodium, elev legs, & wear support  hose...  HYPERCHOLESTEROLEMIA (ICD-272.0) - prev on SIMVASTATIN 20mg /d & tol well, but she refused to continue Rx due to fam hx liver cancer & liver disease... ~  [place] 2/08 on Lip10 showed TChol 188, TG 176, HDL 63, LDL 90 ~  FLP 1/10 on Simva20 showed TChol 188, TG 108, HDL 59, LDL 107 ~  FLP 2/11 on Simva20 showed TChol 168, TG 181, HDL 53, LDL 79 ~  FLP 2/12 on Simva20 showed TChol 164, TG 120, HDL 46, LDL 94 ~  FLP 2/13 on Simva20 showed TChol 191, TG 177, HDL 52, LDL 103... She refuses to continue the Simva due to +FamHx liver dis... ~  FLP 7/14 on diet alone showed TChol 262, TG 279, HDL 48, LDL 161.Marland Kitchen She is referred to the [facility]...  THYROID NODULES >>  ~  CT Chest 7/12 & 8/13 showed incidental bilat thyroid nodules (no changes serially)...  GERD (ICD-530.81) - she uses OTC Prilosec as needed... denies nausea, vomiting, heartburn, diarrhea, constipation, blood in stool, abdominal pain, swelling, gas...  DIVERTICULOSIS OF COLON (ICD-562.10) - she is essent asymptomatic... ~  colonoscopy 12/00 by DrStark was neg> f/u planned 6yrs. ~  colonoscopy 3/11 by DrStark showed mild divertics, otherw neg.  Hx of BREAST CYST (ICD-610.0) - benign breast cyst asp 1989, she gets yearly mammogram at [facility].  KIDNEY STONES>>  ~  6/12:  she developed sudden right flank pain 01/27/11 w/ N & V, went to the ER where she was diagnosed w/ right ureteral stone; treated w/ pain meds, fluids, & Keflex for infection; she was already sched for incontinence surg by DrGrapey- so on 02/13/11 he did a cysto, retrograde pyelogram, holmium laser lithotripsy w/ basketing of the fragments, double J stent, AND retropubic suburethral sling;  She reports great result- stone ablated & incont resolved...  ~  7/13:  Pt reports that DrGrapey saw 2 sm stones in kidney but currently asymptomatic... ~  11/13: Mary [name] had another kidney stone 11/13, this one on the right w/ hydroneph, & required lithotripsy, stent, removed,  now improved; CTAbd showed bilat non-obstructive stones in kidneys... ~  12/13: f/u by Urology s/p stent removal & Sonar showed the hydroneph has resolved; given stone prevention guidelines...  DEGENERATIVE JOINT DISEASE (ICD-715.90) - she notes left shoulder pain w/ decr ROM & asked to f/u w/ Ortho. Hx of DEGENERATIVE DISC DISEASE (ICD-722.6) - s/p lumbar laminectomy, no signif LBP now & walks some... she has DJD but not significantly limited and uses OTC meds Prn. CARPAL TUNNEL SYNDROME, BILATERAL (ICD-354.0) - she has seen DrAplington in the past; uses wrist splints qHS- had right carpel tunnel release, & right rotator cuff surg... ~  2/14: s/p carpel tunnel release left hand by DrAplington  OSTEOPOROSIS (ICD-733.00) - on Calcium, Vits, & Vit D; prev on Alendronate x yrs & placed on hold 2/12 for drug holiday... ~  BMD 12/07 showed TScores -0.8 to -1.9 ( sl better spine, sl worse hips)... rec- Alendronate 70mg /wk. ~  labs 2/08 w/ Vit D level = 34... rec to take Vit D 1000 u OTC daily... ~  BMD 1/10 on Alendronate showed TScores +0.2 in Spine, & -1.9 in right FemNeck... rec> continue Rx. ~  labs 1/10 showed Vit D level = 39... rec> continue 1000 u OTC daily. ~  2/12: she's been on Alendronate for yrs & we discussed stopping this med for a "drug holiday"... ~  labs 2/12 showed Vit D level = 52... continue same; but we decided on a Bisphos drug holiday for the next yr. ~  Labs  2/13 showed Vit D level = 59... Continue same. ~  BMD 2/14 showed TScores +0.4 in Spine , and -1.8 in right FemNeck... As she is stable we will continue off Bisphos rx...  ANXIETY (ICD-300.00)  HEALTH MAINTANENCE = all up-to-date. ~  COLONOSCOPY:  done 3/11 by DrStark & neg x for divertics... ~  GYN:  DrHorvath - on Estradiol + Medroxyprogesterone... ~  MAMMOGRAM:  at [facility] yearly... ~  BMD:  done 1/10 here w/ OSTEOPENIA, TScores +0.2  to -1.9, sl better in spine & sl worse in hips. ~  VACCINATIONS:  she gets the  yearly Flu vaccine; PNEUMOVAX 11/99 & 1/10 here,  TETANUS shot 11/99 & 1/10 here...  ~  NOTE:  she takes ACYCLOVIR 400Tid x5d as needed for HSV1 outbreaks...   Past Surgical History  Procedure Laterality Date  . Right rotator cuff surgery  04/2005    Dr. Collier Salina  . Laminectomy and microdiscectomy lumbar spine  02/2002    Dr. Collier Salina  . Holmium laser lithotripsy for right ureteral stone  6/12    by DrGrapey  . Retropubic suburethral sling for urinary incontinence  6/12     by DrGrapey  . Cystoscopy w/ ureteral stent placement  07/14/2012    Procedure: CYSTOSCOPY WITH RETROGRADE PYELOGRAM/URETERAL STENT PLACEMENT;  Surgeon: Ailene Rud, MD;  Location: [facility];  Service: Urology;  Laterality: Right;  . Holmium laser application  01/60/1093    Procedure: HOLMIUM LASER APPLICATION;  Surgeon: Ailene Rud, MD;  Location: [facility];  Service: Urology;  Laterality: Right;  . Carpal tunnel release Right 2009  . Carpal tunnel release Left 10/30/2012    Procedure: CARPAL TUNNEL RELEASE;  Surgeon: Laurice Record  Aplington, MD;  Location: [place];  Service: Orthopedics;  Laterality: Left;  . Finger arthroplasty Left 10/30/2012    Procedure: thumb ARTHROPLASTY;  Surgeon: Magnus Sinning, MD;  Location: [facility];  Service: Orthopedics;  Laterality: Left;  carpometacarpal arthroplasty thumb    Outpatient Encounter Prescriptions as of 10/06/2013  Medication Sig  . acyclovir (ZOVIRAX) 400 MG tablet TAKE 1 TABLET THREE TIMES A DAY FOR 5 DAYS AS DIRECTED FOR FEVER BLISTERS  . aspirin 81 MG tablet Take 81 mg by mouth daily.    . bisoprolol-hydrochlorothiazide (ZIAC) 5-6.25 MG per tablet TAKE 1 TABLET BY MOUTH DAILY.  . Calcium Carbonate-Vit D-Min (CALTRATE 600+D PLUS) 600-400 MG-UNIT per tablet Take 1 tablet by mouth daily.    . cholecalciferol (VITAMIN D) 1000 UNITS tablet Take 1,000 Units by mouth daily.    Marland Kitchen losartan (COZAAR) 100 MG tablet TAKE 1 TABLET (100  MG TOTAL) BY MOUTH DAILY.  . Multiple Vitamins-Minerals (WOMENS MULTIVITAMIN PLUS) TABS Take 1 tablet by mouth daily.    . simvastatin (ZOCOR) 20 MG tablet TAKE 1 TABLET (20 MG TOTAL) BY MOUTH AT BEDTIME.  . vitamin C (ASCORBIC ACID) 500 MG tablet Take 500 mg by mouth daily.      Allergies  Allergen Reactions  . Sulfonamide Derivatives     REACTION: nausea    Review of Systems       The patient denies fever, chills, sweats, anorexia, fatigue, weakness, malaise, weight loss, sleep disorder, blurring, diplopia, eye irritation, eye discharge, vision loss, eye pain, photophobia, earache, ear discharge, tinnitus, decreased hearing, nasal congestion, nosebleeds, sore throat, hoarseness, chest pain, palpitations, syncope, dyspnea on exertion, orthopnea, PND, peripheral edema, cough, dyspnea at rest, excessive sputum, hemoptysis, wheezing, pleurisy, nausea, vomiting, diarrhea, constipation, change in bowel habits, abdominal pain, melena, hematochezia, jaundice, gas/bloating, indigestion/heartburn, dysphagia, odynophagia, dysuria, hematuria, urinary frequency, urinary hesitancy, nocturia, back pain, joint pain, joint swelling, muscle cramps, muscle weakness, stiffness, sciatica, restless legs, leg pain at night, leg pain with exertion, rash, itching, dryness, suspicious lesions, paralysis, paresthesias, seizures, tremors, vertigo, transient blindness, frequent falls, frequent headaches, difficulty walking, depression, anxiety, memory loss, confusion, cold intolerance, heat intolerance, polydipsia, polyphagia, polyuria, unusual weight change, abnormal bruising, bleeding, enlarged lymph nodes, urticaria, allergic rash, hay fever, and recurrent infections.     Objective:   Physical Exam     WD, WN, 73 y/o WF in NAD... GENERAL:  Alert & oriented; pleasant & cooperative... HEENT:  [place]/AT, EOM-wnl, PERRLA, Fundi-benign, EACs-clear, TMs-wnl, NOSE-clear, THROAT-clear & wnl. NECK:  Supple w/ full ROM; no JVD;  normal carotid impulses w/o bruits; no thyromegaly or nodules palpated; no lymphadenopathy. CHEST:  Clear to P & A; without wheezes/ rales/ or rhonchi. HEART:  Regular Rhythm; without murmurs/ rubs/ or gallops. ABDOMEN:  Soft & nontender; normal bowel sounds; no organomegaly or masses detected. EXT: without deformities, mild arthritic changes; no varicose veins/ venous insuffic/ or edema. NEURO:  CN's intact; motor testing normal; sensory testing normal; gait normal & balance OK. DERM:  No lesions noted; no rash etc...  RADIOLOGY DATA:  Reviewed in the EPIC EMR & discussed w/ the patient...  LABORATORY DATA:  Reviewed in the EPIC EMR & discussed w/ the patient...   Assessment & Plan:    PULM NODULE>   60mm RML nodule seen on CT scan, not visible on CXRs to date; she is low risk (never smoker etc) & f/u CT Chest ==> no change in 68mm nodule & Radiology rec f/u CT in 18-50mo.Marland KitchenMarland Kitchen  Hx Sinusitis & Bronchitis>  [facility]/ ?antibiotics & cough syrup- resolved...  HBP>  Controlled on meds...  CHOL>  [facility] has restarted her Simva20, good control & she feels better about taking the med...  GI>  GERD, Divertics>  Stable & up to date...  Kidney Stones> Eval & Rx from [facility]...  DJD, Osteopenia>  Stable & doing well; BMD showed stable TScores & we decided to leave the Bisphos off for another cycle...  Anxiety>  She is handling the stress well, does not request anxiolytics...   Patient's Medications  New Prescriptions   No medications on file  Previous Medications   ACYCLOVIR (ZOVIRAX) 400 MG TABLET    TAKE 1 TABLET THREE TIMES A DAY FOR 5 DAYS AS DIRECTED FOR FEVER BLISTERS   ASPIRIN 81 MG TABLET    Take 81 mg by mouth daily.     BISOPROLOL-HYDROCHLOROTHIAZIDE (ZIAC) 5-6.25 MG PER TABLET    TAKE 1 TABLET BY MOUTH DAILY.   CALCIUM CARBONATE-VIT D-MIN (CALTRATE 600+D PLUS) 600-400 MG-UNIT PER TABLET    Take 1 tablet by mouth daily.      CHOLECALCIFEROL (VITAMIN D) 1000 UNITS TABLET    Take 1,000 Units by mouth daily.     LOSARTAN (COZAAR) 100 MG TABLET    TAKE 1 TABLET (100 MG TOTAL) BY MOUTH DAILY.   MULTIPLE VITAMINS-MINERALS (WOMENS MULTIVITAMIN PLUS) TABS    Take 1 tablet by mouth daily.     VITAMIN C (ASCORBIC ACID) 500 MG TABLET    Take 500 mg by mouth daily.    Modified Medications   Modified Medication Previous Medication   SIMVASTATIN (ZOCOR) 20 MG TABLET simvastatin (ZOCOR) 20 MG tablet      TAKE 1 TABLET (20 MG TOTAL) BY MOUTH AT BEDTIME.    TAKE 1 TABLET (20 MG TOTAL) BY MOUTH AT BEDTIME.   SIMVASTATIN (ZOCOR) 20 MG TABLET simvastatin (ZOCOR) 20 MG tablet      TAKE 1 TABLET (20 MG TOTAL) BY MOUTH AT BEDTIME.    Take 1 tablet (20 mg total) by mouth at bedtime.  Discontinued Medications   No medications on file

## 2013-10-06 NOTE — Patient Instructions (Signed)
Today we updated your med list in our EPIC system...    Continue your current medications the same...  We will refill your meds for 90d supplies as requested...  Keep up the good work w/ diet & exercise...  Today we did your follow up CXR & blood work...    We will contact you w/ the results when available...   Call for any questions...  Let's plan a follow up visit in 92mo, sooner if needed for problems.Marland KitchenMarland Kitchen

## 2014-02-03 ENCOUNTER — Other Ambulatory Visit: Payer: Self-pay | Admitting: Pulmonary Disease

## 2014-03-15 IMAGING — CT CT ABD-PELV W/O CM
1 series · 14 of 20 positions shown, 19 images · non-contrast
Comparison: CT scan 01/27/2011

CLINICAL DATA: Right flank pain

CT ABDOMEN AND PELVIS WITHOUT CONTRAST
TECHNIQUE: Multidetector CT imaging of the abdomen and pelvis was
performed following the standard protocol without intravenous
contrast.

[Series 6: lung · axial · 0.67mm/px · z∈[-310,-225]mm · 14 of 20 slices shown, 19 images]
[im 2/20  soft-tissue]
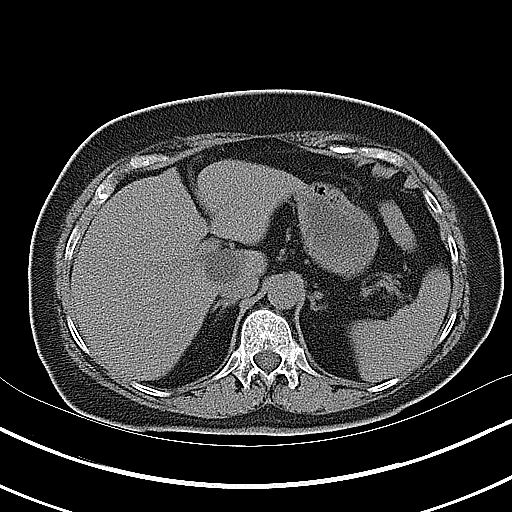
[im 2/20  bone]
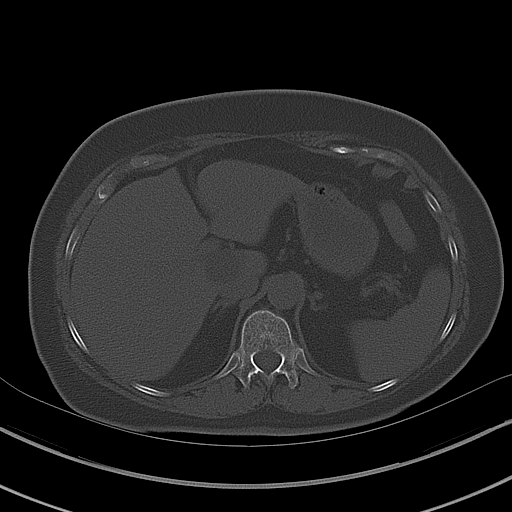
[im 4/20  soft-tissue]
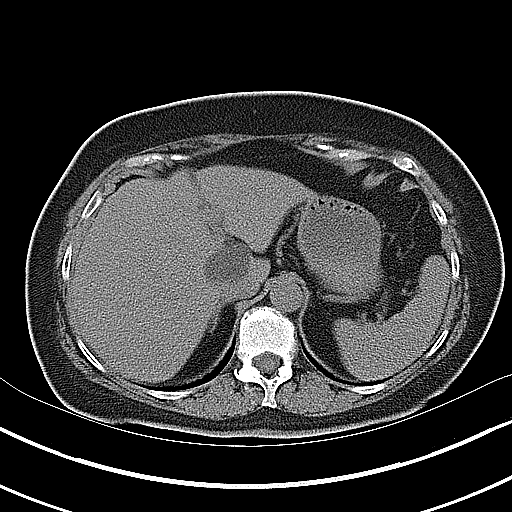
[im 5/20  soft-tissue]
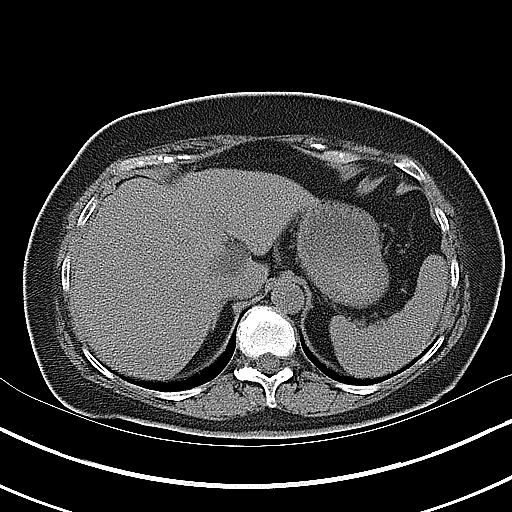
[im 6/20  soft-tissue]
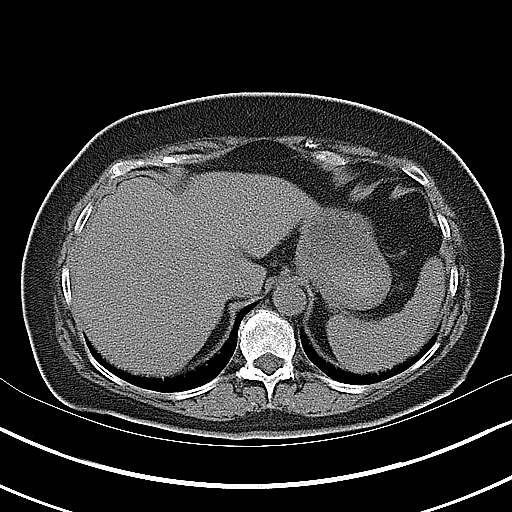
[im 8/20  soft-tissue]
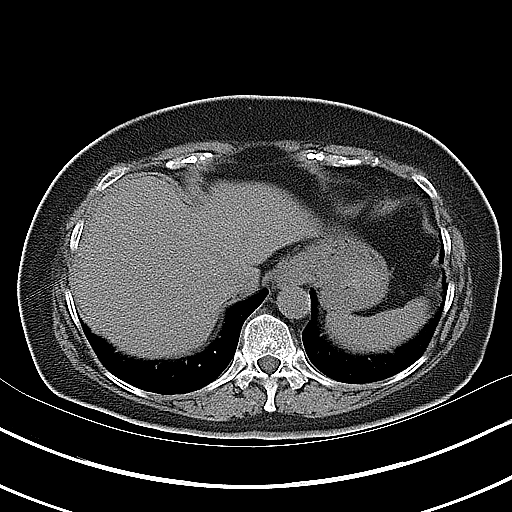
[im 9/20  soft-tissue]
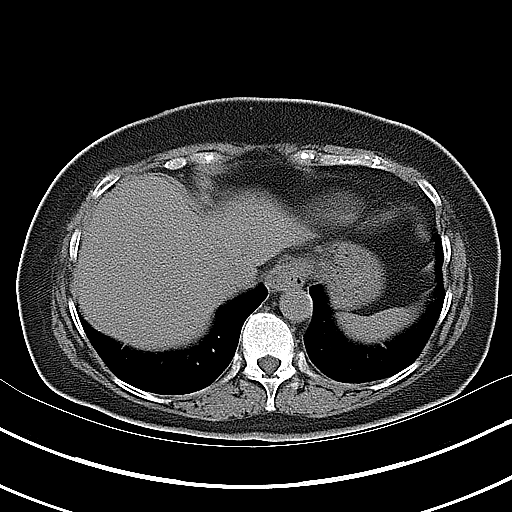
[im 11/20  soft-tissue]
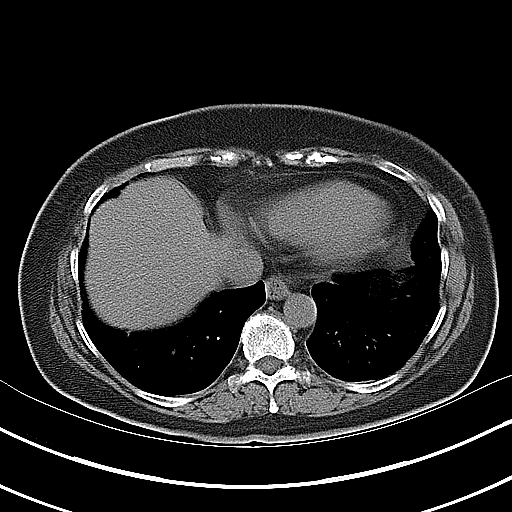
[im 12/20  soft-tissue]
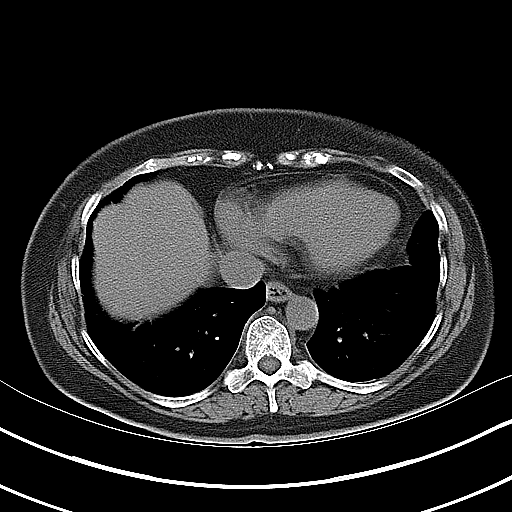
[im 13/20  soft-tissue]
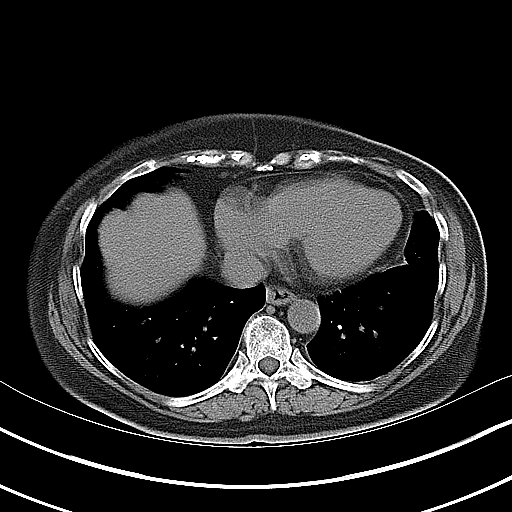
[im 13/20  bone]
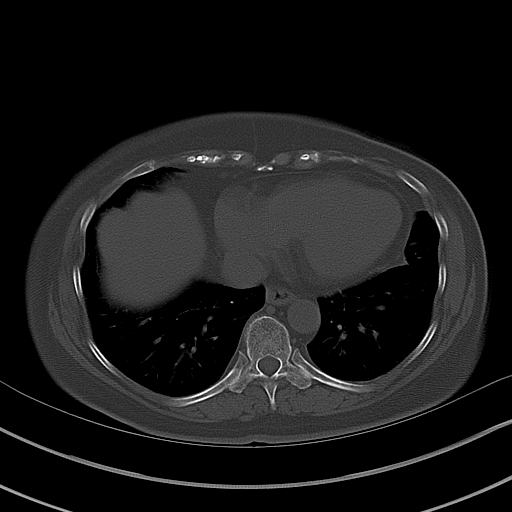
[im 15/20  soft-tissue]
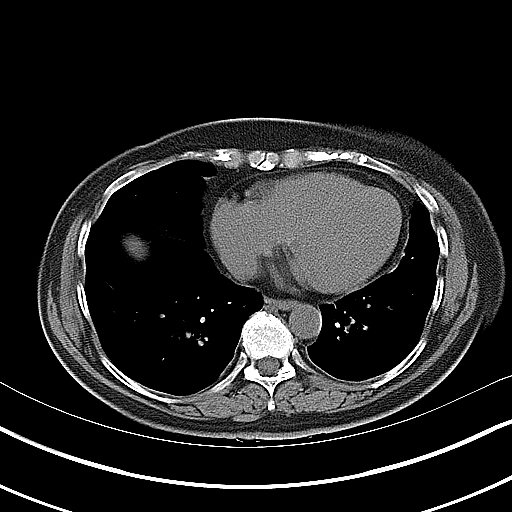
[im 16/20  soft-tissue]
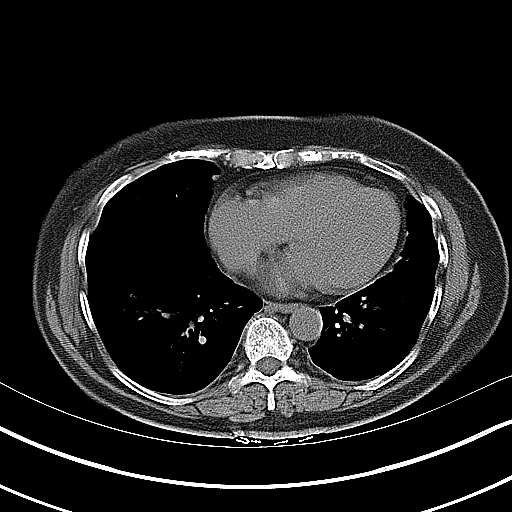
[im 16/20  lung]
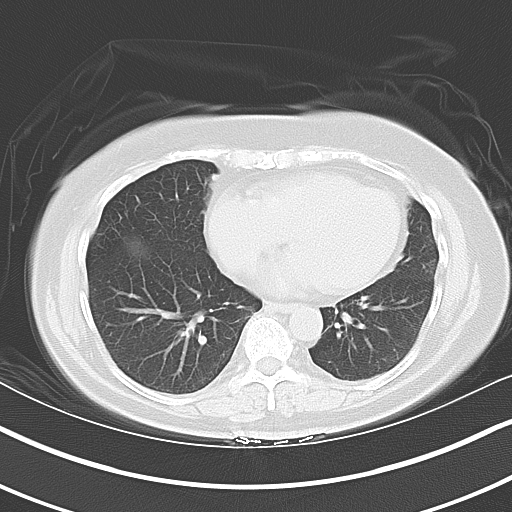
[im 17/20  soft-tissue]
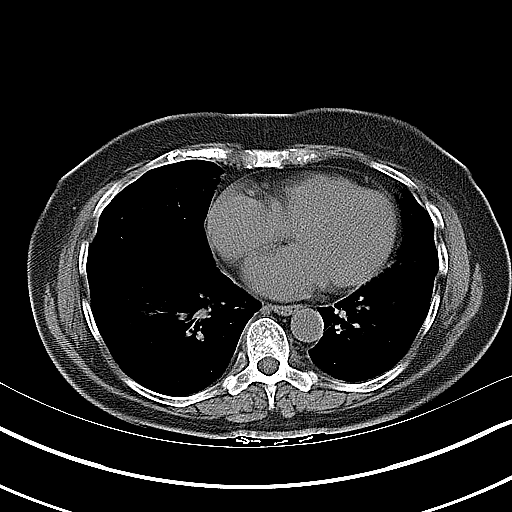
[im 17/20  lung]
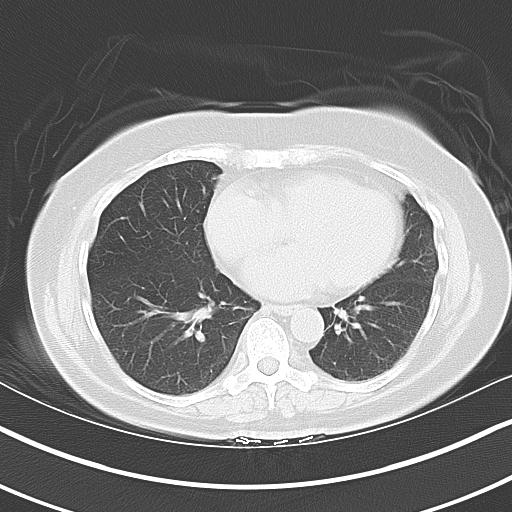
[im 18/20  lung]
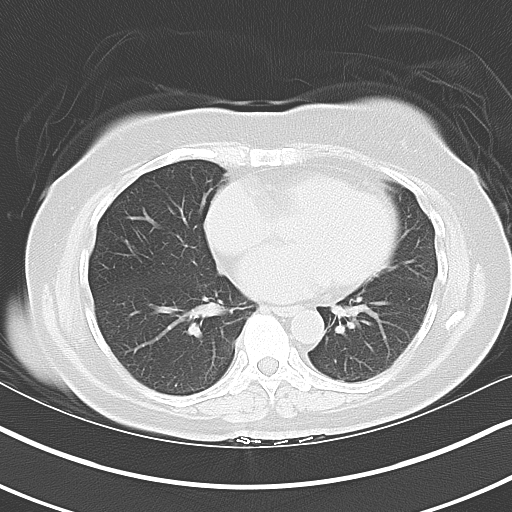
[im 19/20  soft-tissue]
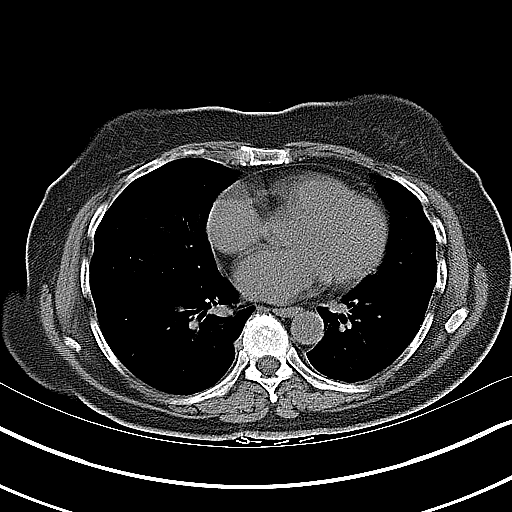
[im 19/20  lung]
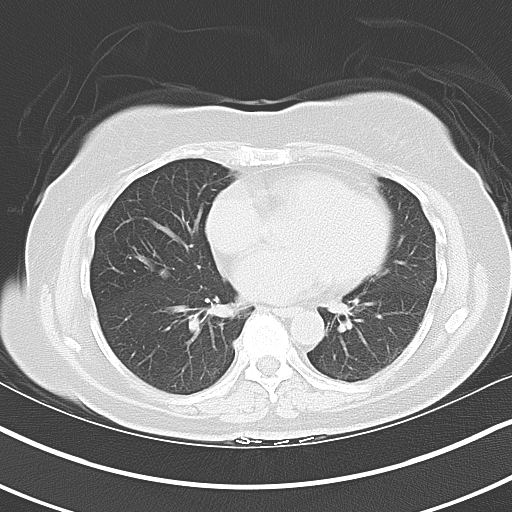

[14 of 20 positions shown; findings below may reference images not displayed]

FINDINGS: Sagittal images of the spine shows disc space flattening
with endplate sclerotic changes and mild anterior spurring at L4-L5
level.

Lung bases shows stable 9 mm nodule in the right lower lobe.

Unenhanced liver shows no biliary ductal dilatation.  A cyst in
caudate lobe of the liver measures 3.4 cm stable from prior exam.

No calcified gallstones are noted within gallbladder.  Pancreas,
spleen and adrenal glands are unremarkable.  There is moderate
right hydronephrosis and right hydroureter.  Nonobstructive
calcified calculus in the lower pole of the right kidney measures
1.9 mm.

Nonobstructive calcified calculus in lower pole of the left kidney
measures 2 mm.  Bilateral no proximal ureteral calculi are noted.

Retroflexed uterus is noted.  No small bowel obstruction.  No
ascites or free air.  No adenopathy.  No pericecal inflammation.

In axial image 69 there is calcified obstructive calculus in distal
right ureter measures 8.5 millimeters by 4.7 mm.  This is located
about 5 mm from right UVJ.  Distal left ureter is unremarkable.  No
calcified calculi are noted within urinary bladder.
IMPRESSION: 1.  There is moderate right hydronephrosis and right hydroureter.
2.  Bilateral nonobstructive nephrolithiasis.
3.  There is a 8.5 x 5 mm calcified obstructive calculus in distal
right ureter about 5 mm from right UVJ.
4.  Stable 9 mm nodule in the right lower lobe.
5.  Stable cyst in caudate lobe of the liver.

## 2014-03-19 IMAGING — CT CT ABD-PELV W/O CM
1 series · 15 of 26 positions shown, 19 images · non-contrast
Comparison: 07/13/2012

***ADDENDUM*** CREATED: 07/19/2012 [DATE]

There is a gender error in the initial dictation. No prostate is
present.  The patient's uterus and adnexa are grossly unremarkable.
***END ADDENDUM*** SIGNED BY: Kaki Jim, M.D.
CLINICAL DATA: Right flank pain.  Recent lithotripsy.
CT ABDOMEN AND PELVIS WITHOUT CONTRAST
TECHNIQUE: Multidetector CT imaging of the abdomen and pelvis was
performed following the standard protocol without intravenous
contrast.

[Series 6: lung · axial · 0.74mm/px · z∈[-231,-116]mm · 15 of 26 slices shown, 19 images]
[im 2/26  soft-tissue]
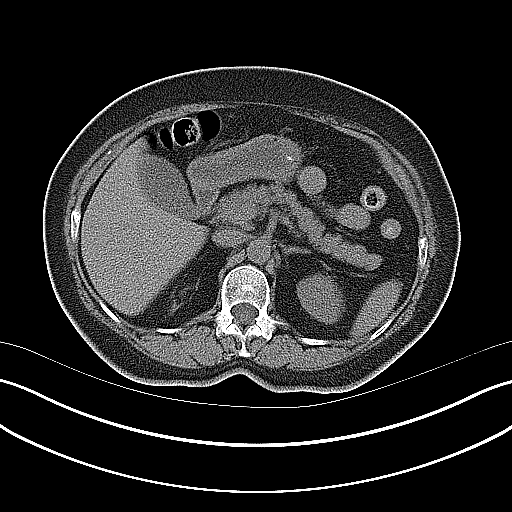
[im 2/26  bone]
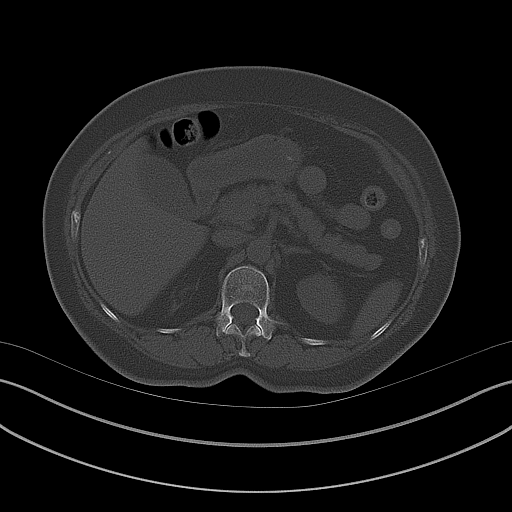
[im 4/26  soft-tissue]
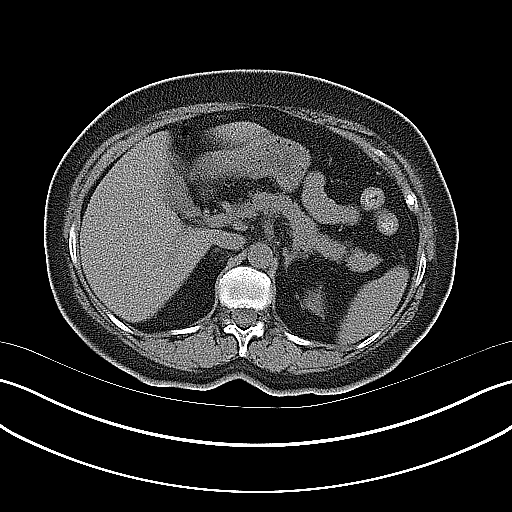
[im 6/26  soft-tissue]
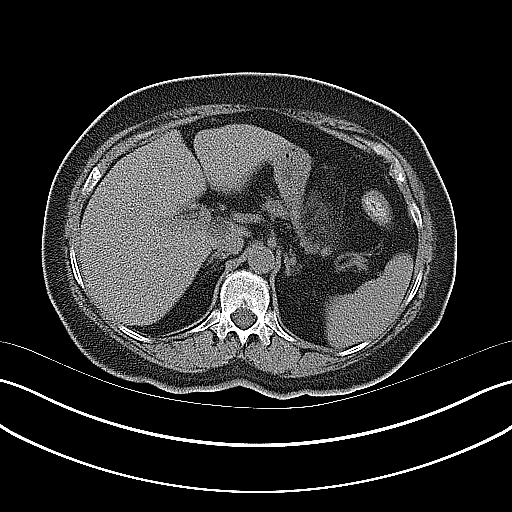
[im 8/26  soft-tissue]
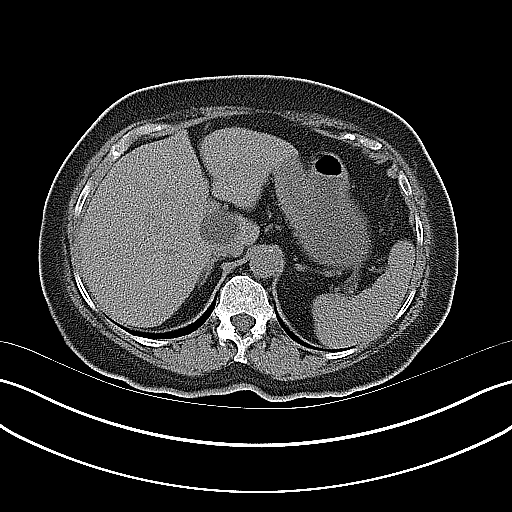
[im 10/26  soft-tissue]
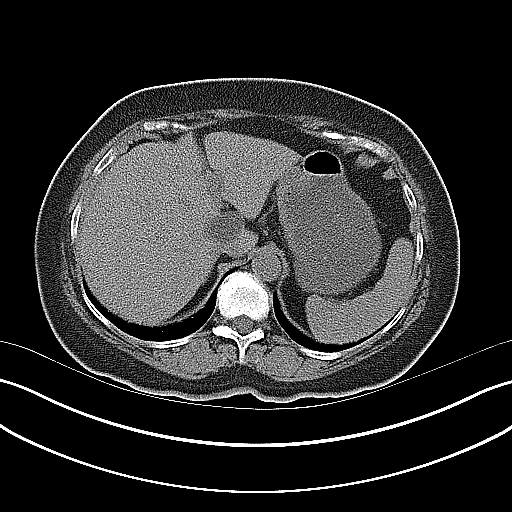
[im 12/26  soft-tissue]
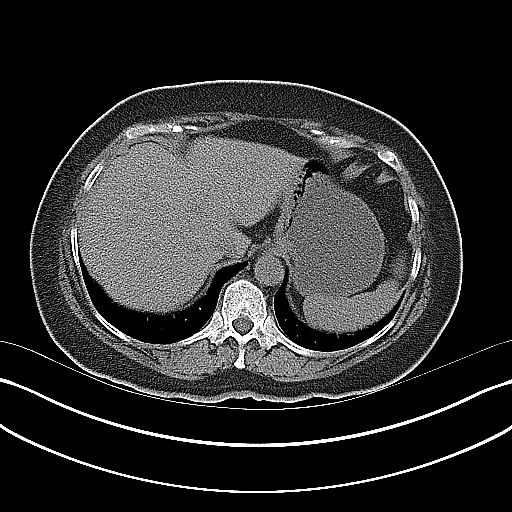
[im 14/26  soft-tissue]
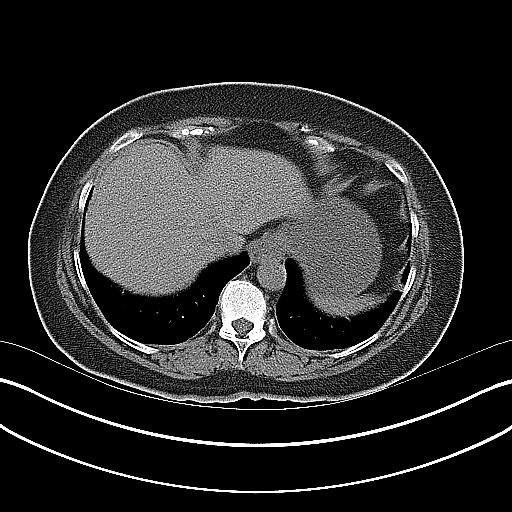
[im 15/26  soft-tissue]
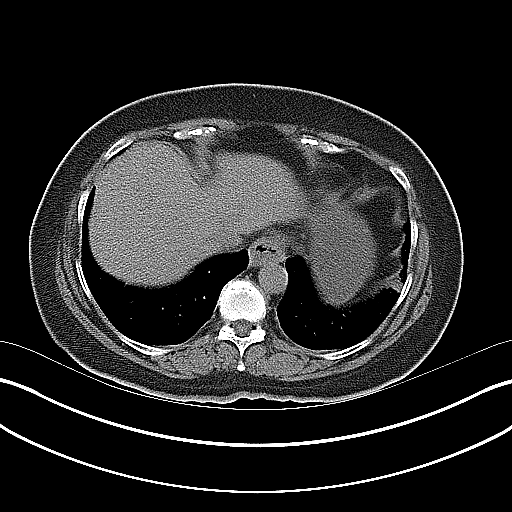
[im 17/26  soft-tissue]
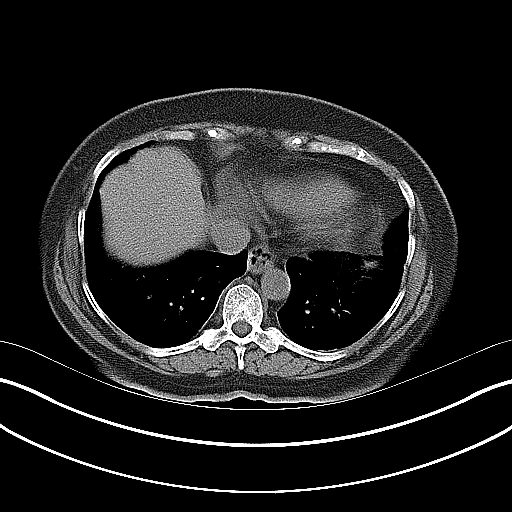
[im 17/26  bone]
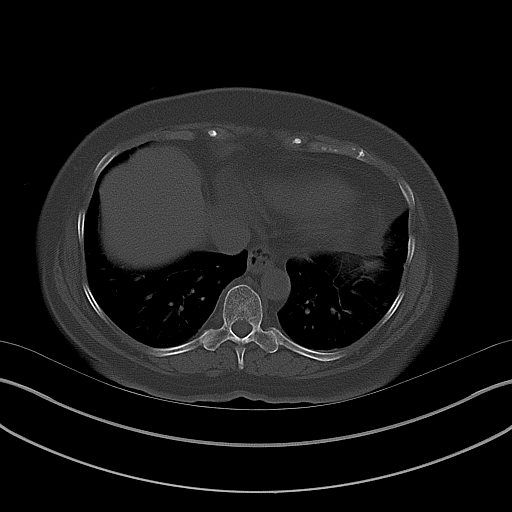
[im 19/26  soft-tissue]
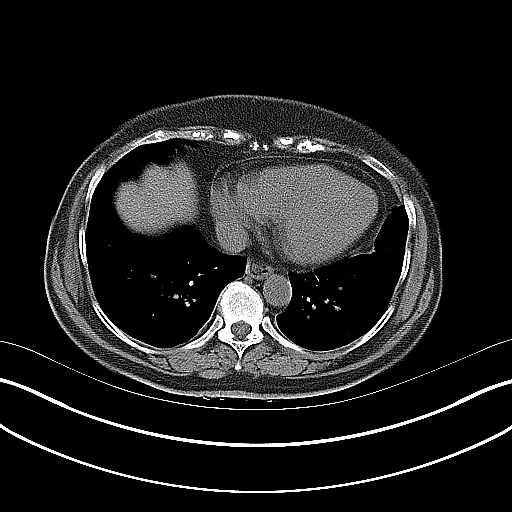
[im 21/26  soft-tissue]
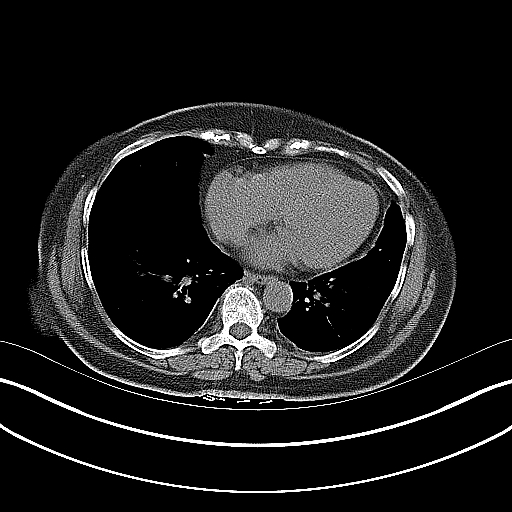
[im 22/26  lung]
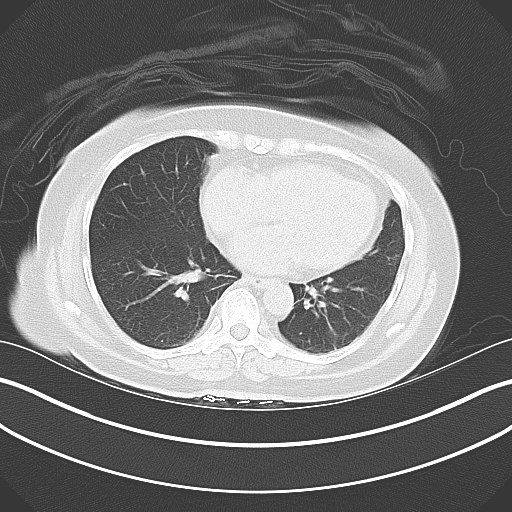
[im 23/26  soft-tissue]
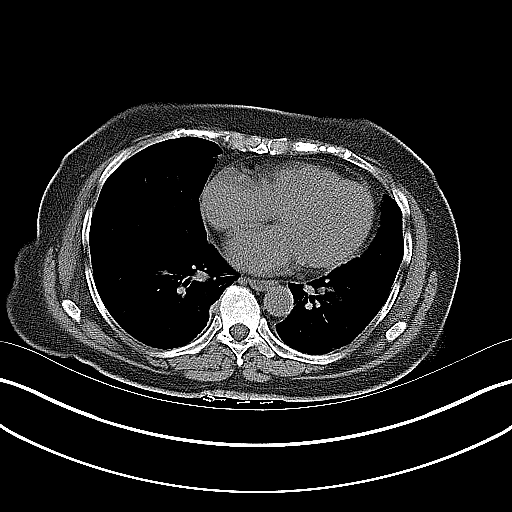
[im 23/26  lung]
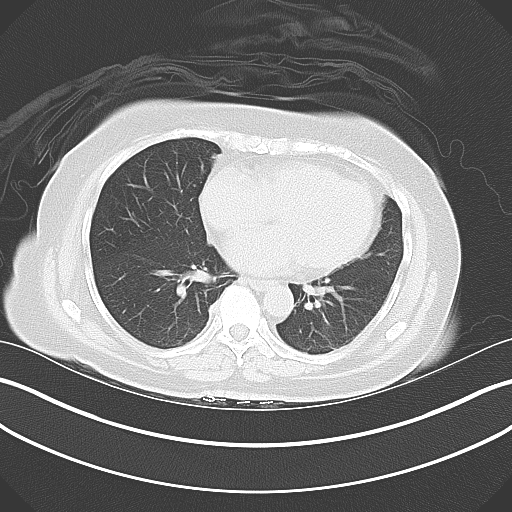
[im 24/26  lung]
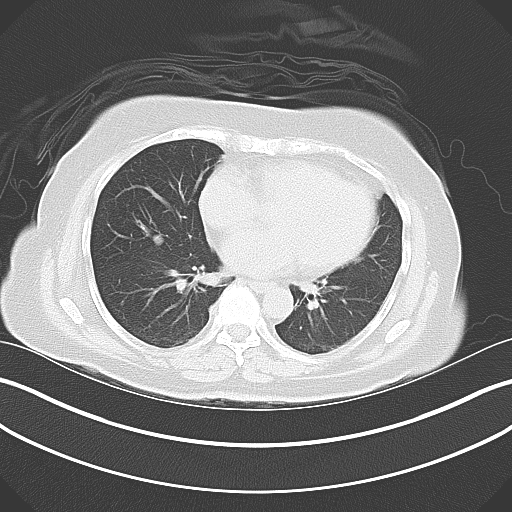
[im 25/26  soft-tissue]
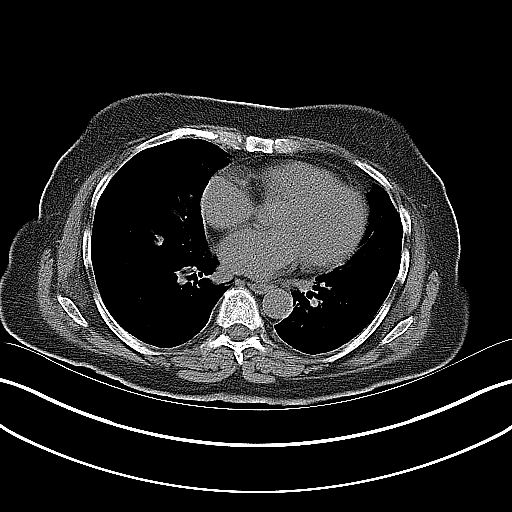
[im 25/26  lung]
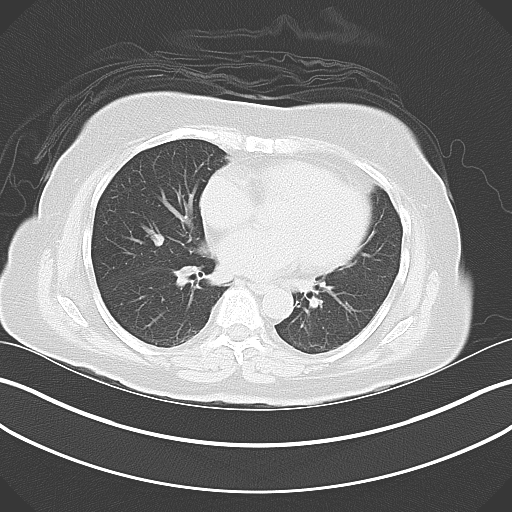

[15 of 26 positions shown; findings below may reference images not displayed]

FINDINGS: Heart is upper limits normal in size.  Lung bases are
clear.  No effusions.  Small hiatal hernia.

Low density cystic area noted within the caudate lobe of the liver,
stable.  Spleen, pancreas, adrenals and left kidney are
unremarkable as is the gallbladder and stomach.

Right ureteral stent is in place.  There is dense moderate right
hydronephrosis, similar or slightly increased when comparing the
renal pelvis to prior study.  No visible ureteral stones.  The
ureteral stent ends in the bladder.  Mildly prominent prostate.  No
hydronephrosis or stones on the left. Tiny punctate right lower
pole renal stone, stable.

Uterus, adnexa, large and small bowel grossly unremarkable.  No
acute bony abnormality.
IMPRESSION: Right ureteral stent in place with moderate right hydronephrosis,
stable or slightly progressed since prior study.  No visible
ureteral stones.

Tiny punctate nonobstructing right lower pole stones, stable.

## 2014-04-06 ENCOUNTER — Ambulatory Visit: Payer: Medicare Other | Admitting: Pulmonary Disease

## 2014-05-20 ENCOUNTER — Ambulatory Visit: Payer: Medicare HMO | Admitting: Internal Medicine

## 2014-05-20 ENCOUNTER — Telehealth: Payer: Self-pay | Admitting: Internal Medicine

## 2014-05-20 NOTE — Telephone Encounter (Signed)
Patient is establishing care with Dr. Asa Lente.  We had to move her appt out bc of schedule changes.  She is requesting a [employer] fast streamer referral to [place] to Bobbye [place] and [facility] to Rana Snare.  Updated insurance card is in system.

## 2014-06-04 ENCOUNTER — Other Ambulatory Visit (INDEPENDENT_AMBULATORY_CARE_PROVIDER_SITE_OTHER): Payer: Medicare HMO

## 2014-06-04 ENCOUNTER — Ambulatory Visit (INDEPENDENT_AMBULATORY_CARE_PROVIDER_SITE_OTHER): Payer: Medicare HMO | Admitting: Internal Medicine

## 2014-06-04 ENCOUNTER — Encounter: Payer: Self-pay | Admitting: Internal Medicine

## 2014-06-04 VITALS — BP 162/82 | HR 66 | Temp 98.2°F | Ht 60.0 in | Wt 141.5 lb

## 2014-06-04 DIAGNOSIS — E7800 Pure hypercholesterolemia, unspecified: Secondary | ICD-10-CM

## 2014-06-04 DIAGNOSIS — R5383 Other fatigue: Secondary | ICD-10-CM

## 2014-06-04 DIAGNOSIS — Z23 Encounter for immunization: Secondary | ICD-10-CM

## 2014-06-04 DIAGNOSIS — I10 Essential (primary) hypertension: Secondary | ICD-10-CM

## 2014-06-04 DIAGNOSIS — Z0000 Encounter for general adult medical examination without abnormal findings: Secondary | ICD-10-CM

## 2014-06-04 LAB — CBC WITH DIFFERENTIAL/PLATELET
BASOS PCT: 0.4 % (ref 0.0–3.0)
Basophils Absolute: 0 10*3/uL (ref 0.0–0.1)
EOS PCT: 1.2 % (ref 0.0–5.0)
Eosinophils Absolute: 0.1 10*3/uL (ref 0.0–0.7)
HCT: 43 % (ref 36.0–46.0)
HEMOGLOBIN: 14.6 g/dL (ref 12.0–15.0)
LYMPHS PCT: 19.5 % (ref 12.0–46.0)
Lymphs Abs: 2.2 10*3/uL (ref 0.7–4.0)
MCHC: 34 g/dL (ref 30.0–36.0)
MCV: 90.9 fl (ref 78.0–100.0)
Monocytes Absolute: 0.9 10*3/uL (ref 0.1–1.0)
Monocytes Relative: 8.3 % (ref 3.0–12.0)
Neutro Abs: 8 10*3/uL — ABNORMAL HIGH (ref 1.4–7.7)
Neutrophils Relative %: 70.6 % (ref 43.0–77.0)
Platelets: 249 10*3/uL (ref 150.0–400.0)
RBC: 4.73 Mil/uL (ref 3.87–5.11)
RDW: 13.4 % (ref 11.5–15.5)
WBC: 11.3 10*3/uL — AB (ref 4.0–10.5)

## 2014-06-04 LAB — BASIC METABOLIC PANEL
BUN: 14 mg/dL (ref 6–23)
CALCIUM: 9.5 mg/dL (ref 8.4–10.5)
CHLORIDE: 103 meq/L (ref 96–112)
CO2: 29 mEq/L (ref 19–32)
CREATININE: 1 mg/dL (ref 0.4–1.2)
GFR: 56.62 mL/min — ABNORMAL LOW (ref >60.00)
Glucose, Bld: 98 mg/dL (ref 70–99)
Potassium: 3.8 mEq/L (ref 3.5–5.1)
SODIUM: 137 meq/L (ref 135–145)

## 2014-06-04 LAB — LIPID PANEL
Cholesterol: 190 mg/dL (ref 0–200)
HDL: 62.6 mg/dL (ref >39.00)
LDL CALC: 100 mg/dL — AB (ref 0–99)
NonHDL: 127.4
TRIGLYCERIDES: 139 mg/dL (ref 0.0–149.0)
Total CHOL/HDL Ratio: 3
VLDL: 27.8 mg/dL (ref 0.0–40.0)

## 2014-06-04 LAB — HEPATIC FUNCTION PANEL
ALK PHOS: 73 U/L (ref 39–117)
ALT: 19 U/L (ref 0–35)
AST: 21 U/L (ref 0–37)
Albumin: 3.9 g/dL (ref 3.5–5.2)
Bilirubin, Direct: 0.1 mg/dL (ref 0.0–0.3)
TOTAL PROTEIN: 7.7 g/dL (ref 6.0–8.3)
Total Bilirubin: 0.9 mg/dL (ref 0.2–1.2)

## 2014-06-04 LAB — TSH: TSH: 0.56 u[IU]/mL (ref 0.35–4.50)

## 2014-06-04 MED ORDER — LOSARTAN POTASSIUM 100 MG PO TABS: 100.0000 mg | ORAL_TABLET | Freq: Every day | ORAL | Status: DC

## 2014-06-04 MED ORDER — ACYCLOVIR 400 MG PO TABS: ORAL_TABLET | ORAL | Status: DC

## 2014-06-04 MED ORDER — SIMVASTATIN 20 MG PO TABS: 20.0000 mg | ORAL_TABLET | Freq: Every day | ORAL | Status: DC

## 2014-06-04 MED ORDER — BISOPROLOL-HYDROCHLOROTHIAZIDE 10-6.25 MG PO TABS: 1.0000 | ORAL_TABLET | Freq: Every day | ORAL | Status: DC

## 2014-06-04 NOTE — Assessment & Plan Note (Signed)
Titrate Ziac now BP Readings from Last 3 Encounters:  06/04/14 162/82  10/06/13 122/88  04/01/13 134/62   Education, pt to call if SBP>140 at home

## 2014-06-04 NOTE — Progress Notes (Signed)
Pre visit review using our clinic review tool, if applicable. No additional management support is needed unless otherwise documented below in the visit note. 

## 2014-06-04 NOTE — Progress Notes (Signed)
Subjective:    Patient ID: Mary [name], female    DOB: 1941-08-09, 73 y.o.   MRN: [id number]  HPI  New to me, transfer from [place] due to his retirement   Here for medicare wellness  Diet: heart healthy, low carb Physical activity: active but no aerobic routine Depression/mood screen: negative Hearing: intact to whispered voice Visual acuity: grossly normal, performs annual eye exam  ADLs: capable Fall risk: none Home safety: good Cognitive evaluation: intact to orientation, naming, recall and repetition EOL planning: adv directives, full code/ I agree  I have personally reviewed and have noted 1. The patient's medical and social history 2. Their use of alcohol, tobacco or illicit drugs 3. Their current medications and supplements 4. The patient's functional ability including ADL's, fall risks, home safety risks and hearing or visual impairment. 5. Diet and physical activities 6. Evidence for depression or mood disorders  Also reviewed chronic medical issues and interval medical events: borderline DM, HTN, lipids, osteopenia, kidney stones  Past Medical History  Diagnosis Date  . Hypertension   . Venous insufficiency   . Hypercholesteremia   . GERD (gastroesophageal reflux disease)   . Diverticulosis of colon   . History of cyst of breast   . DJD (degenerative joint disease)   . DDD (degenerative disc disease)   . Carpal tunnel syndrome     bilateral  . Osteoporosis   . Anxiety   . Kidney stones   . Eyelid cyst    Family History  Problem Relation Age of Onset  . Liver disease    . Liver cancer Mother    History  Substance Use Topics  . Smoking status: Never Smoker   . Smokeless tobacco: Never Used  . Alcohol Use: No    Review of Systems  Constitutional: Negative for fatigue and unexpected weight change.  Respiratory: Negative for cough, shortness of breath and wheezing.   Cardiovascular: Negative for chest pain, palpitations and leg swelling.    Gastrointestinal: Negative for nausea, abdominal pain and diarrhea.  Neurological: Negative for dizziness, weakness, light-headedness and headaches.  Psychiatric/Behavioral: Negative for dysphoric mood. The patient is not nervous/anxious.   All other systems reviewed and are negative.      Objective:   Physical Exam  BP 162/82  Pulse 66  Temp(Src) 98.2 F (36.8 C) (Oral)  Ht 5' (1.524 m)  Wt 141 lb 8 oz (64.184 kg)  BMI 27.63 kg/m2  SpO2 98% Wt Readings from Last 3 Encounters:  06/04/14 141 lb 8 oz (64.184 kg)  10/06/13 141 lb (63.957 kg)  07/16/13 140 lb 8 oz (63.73 kg)   Constitutional: She appears well-developed and well-nourished. No distress.  HENT: Head: Normocephalic and atraumatic. Ears: B TMs ok, no erythema or effusion; Nose: Nose normal. Mouth/Throat: Oropharynx is clear and moist. No oropharyngeal exudate.  Eyes: Conjunctivae and EOM are normal. Pupils are equal, round, and reactive to light. No scleral icterus.  Neck: Normal range of motion. Neck supple. No JVD present. No thyromegaly present.  Cardiovascular: Normal rate, regular rhythm and normal heart sounds.  No murmur heard. No BLE edema. Pulmonary/Chest: Effort normal and breath sounds normal. No respiratory distress. She has no wheezes.  Abdominal: Soft. Bowel sounds are normal. She exhibits no distension. There is no tenderness. no masses GU/breast: defer to gyn Musculoskeletal: Normal range of motion, no joint effusions. No gross deformities Neurological: She is alert and oriented to person, place, and time. No cranial nerve deficit. Coordination, balance, strength, speech and  gait are normal.  Skin: Skin is warm and dry. No rash noted. No erythema.  Psychiatric: She has a normal mood and affect. Her behavior is normal. Judgment and thought content normal.    Lab Results  Component Value Date   WBC 10.1 10/06/2013   HGB 14.9 10/06/2013   HCT 45.5 10/06/2013   PLT 264.0 10/06/2013   GLUCOSE 99 10/06/2013    CHOL 192 07/09/2013   TRIG 142.0 07/09/2013   HDL 60.70 07/09/2013   LDLDIRECT 160.8 04/01/2013   LDLCALC 103* 07/09/2013   ALT 21 07/09/2013   AST 23 07/09/2013   NA 139 10/06/2013   K 4.0 10/06/2013   CL 104 10/06/2013   CREATININE 0.9 10/06/2013   BUN 13 10/06/2013   CO2 27 10/06/2013   TSH 0.67 10/06/2013   HGBA1C 6.3 10/06/2013    Dg Chest 2 View  10/06/2013   CLINICAL DATA:  Pulmonary nodule  EXAM: CHEST  2 VIEW  COMPARISON:  10/06/2011  FINDINGS: The heart size and mediastinal contours are within normal limits. Both lungs are clear. The visualized skeletal structures are unremarkable. Tiny clip in the left breast overlying the left lower lung field is again noted.  IMPRESSION: Stable exam.  No active cardiopulmonary disease.   Electronically Signed   By: Earle Gell M.D.   On: 10/06/2013 13:39       Assessment & Plan:   AWV/cpx/v70.0 - Today patient counseled on age appropriate routine health concerns for screening and prevention, each reviewed and up to date or declined. Immunizations reviewed and up to date or declined. Labs reviewed. Risk factors for depression reviewed and negative. Hearing function and visual acuity are intact. ADLs screened and addressed as needed. Functional ability and level of safety reviewed and appropriate. Education, counseling and referrals performed based on assessed risks today. Patient provided with a copy of personalized plan for preventive services.   Problem List Items Addressed This Visit   Essential hypertension      Titrate Ziac now BP Readings from Last 3 Encounters:  06/04/14 162/82  10/06/13 122/88  04/01/13 134/62   Education, pt to call if SBP>140 at home    Relevant Medications      ZIAC 10-6.25 MG PO TABS      losartan (COZAAR) tablet      simvastatin (ZOCOR) tablet   HYPERCHOLESTEROLEMIA     Periodic concern about simva side effects over the years reviewed Has been compliant with same since meeting with lipid clinic Fall 2014 Check lipids now  and annually    Relevant Medications      ZIAC 10-6.25 MG PO TABS      losartan (COZAAR) tablet      simvastatin (ZOCOR) tablet   Other Relevant Orders      Lipid panel    Other Visit Diagnoses   Routine general medical examination at a health care facility    -  Primary    Need for prophylactic vaccination and inoculation against influenza        Relevant Orders       Flu vaccine HIGH DOSE PF (Fluzone Tri High dose) (Completed)    Other fatigue        Relevant Orders       Basic metabolic panel       CBC with Differential       Hepatic function panel       TSH

## 2014-06-04 NOTE — Patient Instructions (Signed)
It was good to see you today.  Your annual flu shot was given and/or updated today.  We have reviewed your prior records including labs and tests today  Health Maintenance reviewed - all recommended immunizations and age-appropriate screenings are up-to-date.  Test(s) ordered today. Your results will be released to [facility] (or called to you) after review, usually within 72hours after test completion. If any changes need to be made, you will be notified at that same time.  Medications reviewed and updated, Increase Ziac dose for blood pressure -no other changes recommended at this time.  Please schedule followup in 12 months for annual exam and labs, call sooner if problems.

## 2014-06-04 NOTE — Assessment & Plan Note (Signed)
Periodic concern about simva side effects over the years reviewed Has been compliant with same since meeting with lipid clinic Fall 2014 Check lipids now and annually

## 2014-08-04 ENCOUNTER — Other Ambulatory Visit: Payer: Self-pay | Admitting: Obstetrics and Gynecology

## 2014-08-05 LAB — CYTOLOGY - PAP

## 2014-09-10 ENCOUNTER — Encounter: Payer: Self-pay | Admitting: Gastroenterology

## 2014-09-14 DIAGNOSIS — N201 Calculus of ureter: Secondary | ICD-10-CM | POA: Diagnosis not present

## 2014-12-07 ENCOUNTER — Ambulatory Visit: Payer: Medicare HMO | Admitting: Internal Medicine

## 2015-01-13 ENCOUNTER — Encounter: Payer: Self-pay | Admitting: Internal Medicine

## 2015-01-13 ENCOUNTER — Other Ambulatory Visit: Payer: Medicare Other

## 2015-01-13 ENCOUNTER — Ambulatory Visit (INDEPENDENT_AMBULATORY_CARE_PROVIDER_SITE_OTHER): Payer: Medicare Other | Admitting: Internal Medicine

## 2015-01-13 ENCOUNTER — Other Ambulatory Visit (INDEPENDENT_AMBULATORY_CARE_PROVIDER_SITE_OTHER): Payer: Medicare Other

## 2015-01-13 VITALS — BP 128/88 | HR 65 | Temp 98.5°F | Resp 12 | Ht 60.0 in | Wt 142.8 lb

## 2015-01-13 DIAGNOSIS — I10 Essential (primary) hypertension: Secondary | ICD-10-CM

## 2015-01-13 DIAGNOSIS — Z0000 Encounter for general adult medical examination without abnormal findings: Secondary | ICD-10-CM | POA: Diagnosis not present

## 2015-01-13 DIAGNOSIS — E7800 Pure hypercholesterolemia, unspecified: Secondary | ICD-10-CM

## 2015-01-13 DIAGNOSIS — E785 Hyperlipidemia, unspecified: Secondary | ICD-10-CM

## 2015-01-13 DIAGNOSIS — N200 Calculus of kidney: Secondary | ICD-10-CM

## 2015-01-13 LAB — COMPREHENSIVE METABOLIC PANEL
ALBUMIN: 3.9 g/dL (ref 3.5–5.2)
ALK PHOS: 77 U/L (ref 39–117)
ALT: 17 U/L (ref 0–35)
AST: 19 U/L (ref 0–37)
BUN: 17 mg/dL (ref 6–23)
CHLORIDE: 103 meq/L (ref 96–112)
CO2: 31 mEq/L (ref 19–32)
Calcium: 10 mg/dL (ref 8.4–10.5)
Creatinine, Ser: 1.04 mg/dL (ref 0.40–1.20)
GFR: 55.27 mL/min — ABNORMAL LOW (ref >60.00)
Glucose, Bld: 104 mg/dL — ABNORMAL HIGH (ref 70–99)
POTASSIUM: 4.1 meq/L (ref 3.5–5.1)
Sodium: 139 mEq/L (ref 135–145)
TOTAL PROTEIN: 7.1 g/dL (ref 6.0–8.3)
Total Bilirubin: 0.6 mg/dL (ref 0.2–1.2)

## 2015-01-13 LAB — CBC
HCT: 42.5 % (ref 36.0–46.0)
Hemoglobin: 14.6 g/dL (ref 12.0–15.0)
MCHC: 34.3 g/dL (ref 30.0–36.0)
MCV: 92.5 fl (ref 78.0–100.0)
PLATELETS: 286 10*3/uL (ref 150.0–400.0)
RBC: 4.59 Mil/uL (ref 3.87–5.11)
RDW: 13.2 % (ref 11.5–15.5)
WBC: 11.3 10*3/uL — ABNORMAL HIGH (ref 4.0–10.5)

## 2015-01-13 LAB — LIPID PANEL
CHOL/HDL RATIO: 3
CHOLESTEROL: 184 mg/dL (ref 0–200)
HDL: 60.5 mg/dL (ref >39.00)
LDL Cholesterol: 97 mg/dL (ref 0–99)
NonHDL: 123.5
TRIGLYCERIDES: 131 mg/dL (ref 0.0–149.0)
VLDL: 26.2 mg/dL (ref 0.0–40.0)

## 2015-01-13 MED ORDER — LOSARTAN POTASSIUM-HCTZ 100-25 MG PO TABS: 1.0000 | ORAL_TABLET | Freq: Every day | ORAL | Status: DC

## 2015-01-13 MED ORDER — METOPROLOL SUCCINATE ER 50 MG PO TB24: 50.0000 mg | ORAL_TABLET | Freq: Every day | ORAL | Status: DC

## 2015-01-13 NOTE — Progress Notes (Signed)
Pre visit review using our clinic review tool, if applicable. No additional management support is needed unless otherwise documented below in the visit note. 

## 2015-01-13 NOTE — Patient Instructions (Addendum)
When you run out of your medicines for blood pressure (the losartan and the ziac (bisoprolol/hctz)) we will change them to be more affordable.   The next medicines will be losartan/hctz and toprol-xl. Take 1 pill of each once a day and this should be about the same as your current medicines.   We will check your blood work today and call you back with the results.   Health Maintenance Adopting a healthy lifestyle and getting preventive care can go a long way to promote health and wellness. Talk with your health care provider about what schedule of regular examinations is right for you. This is a good chance for you to check in with your provider about disease prevention and staying healthy. In between checkups, there are plenty of things you can do on your own. Experts have done a lot of research about which lifestyle changes and preventive measures are most likely to keep you healthy. Ask your health care provider for more information. WEIGHT AND DIET  Eat a healthy diet  Be sure to include plenty of vegetables, fruits, low-fat dairy products, and lean protein.  Do not eat a lot of foods high in solid fats, added sugars, or salt.  Get regular exercise. This is one of the most important things you can do for your health.  Most adults should exercise for at least 150 minutes each week. The exercise should increase your heart rate and make you sweat (moderate-intensity exercise).  Most adults should also do strengthening exercises at least twice a week. This is in addition to the moderate-intensity exercise.  Maintain a healthy weight  Body mass index (BMI) is a measurement that can be used to identify possible weight problems. It estimates body fat based on height and weight. Your health care provider can help determine your BMI and help you achieve or maintain a healthy weight.  For females 21 years of age and older:   A BMI below 18.5 is considered underweight.  A BMI of 18.5 to 24.9  is normal.  A BMI of 25 to 29.9 is considered overweight.  A BMI of 30 and above is considered obese.  Watch levels of cholesterol and blood lipids  You should start having your blood tested for lipids and cholesterol at 73 years of age, then have this test every 5 years.  You may need to have your cholesterol levels checked more often if:  Your lipid or cholesterol levels are high.  You are older than 73 years of age.  You are at high risk for heart disease.  CANCER SCREENING   Lung Cancer  Lung cancer screening is recommended for adults 44-37 years old who are at high risk for lung cancer because of a history of smoking.  A yearly low-dose CT scan of the lungs is recommended for people who:  Currently smoke.  Have quit within the past 15 years.  Have at least a 30-pack-year history of smoking. A pack year is smoking an average of one pack of cigarettes a day for 1 year.  Yearly screening should continue until it has been 15 years since you quit.  Yearly screening should stop if you develop a health problem that would prevent you from having lung cancer treatment.  Breast Cancer  Practice breast self-awareness. This means understanding how your breasts normally appear and feel.  It also means doing regular breast self-exams. Let your health care provider know about any changes, no matter how small.  If you are in  or 30s, you should have a clinical breast exam (CBE) by a health care provider every 1-3 years as part of a regular health exam.  If you are 40 or older, have a CBE every year. Also consider having a breast X-ray (mammogram) every year.  If you have a family history of breast cancer, talk to your health care provider about genetic screening.  If you are at high risk for breast cancer, talk to your health care provider about having an MRI and a mammogram every year.  Breast cancer gene (BRCA) assessment is recommended for women who have family members  with BRCA-related cancers. BRCA-related cancers include:  Breast.  Ovarian.  Tubal.  Peritoneal cancers.  Results of the assessment will determine the need for genetic counseling and BRCA1 and BRCA2 testing. Cervical Cancer Routine pelvic examinations to screen for cervical cancer are no longer recommended for nonpregnant women who are considered low risk for cancer of the pelvic organs (ovaries, uterus, and vagina) and who do not have symptoms. A pelvic examination may be necessary if you have symptoms including those associated with pelvic infections. Ask your health care provider if a screening pelvic exam is right for you.   The Pap test is the screening test for cervical cancer for women who are considered at risk.  If you had a hysterectomy for a problem that was not cancer or a condition that could lead to cancer, then you no longer need Pap tests.  If you are older than 65 years, and you have had normal Pap tests for the past 10 years, you no longer need to have Pap tests.  If you have had past treatment for cervical cancer or a condition that could lead to cancer, you need Pap tests and screening for cancer for at least 20 years after your treatment.  If you no longer get a Pap test, assess your risk factors if they change (such as having a new sexual partner). This can affect whether you should start being screened again.  Some women have medical problems that increase their chance of getting cervical cancer. If this is the case for you, your health care provider may recommend more frequent screening and Pap tests.  The human papillomavirus (HPV) test is another test that may be used for cervical cancer screening. The HPV test looks for the virus that can cause cell changes in the cervix. The cells collected during the Pap test can be tested for HPV.  The HPV test can be used to screen women 30 years of age and older. Getting tested for HPV can extend the interval between normal  Pap tests from three to five years.  An HPV test also should be used to screen women of any age who have unclear Pap test results.  After 73 years of age, women should have HPV testing as often as Pap tests.  Colorectal Cancer  This type of cancer can be detected and often prevented.  Routine colorectal cancer screening usually begins at 73 years of age and continues through 73 years of age.  Your health care provider may recommend screening at an earlier age if you have risk factors for colon cancer.  Your health care provider may also recommend using home test kits to check for hidden blood in the stool.  A small camera at the end of a tube can be used to examine your colon directly (sigmoidoscopy or colonoscopy). This is done to check for the earliest forms of colorectal cancer.    Routine screening usually begins at age 50.  Direct examination of the colon should be repeated every 5-10 years through 73 years of age. However, you may need to be screened more often if early forms of precancerous polyps or small growths are found. Skin Cancer  Check your skin from head to toe regularly.  Tell your health care provider about any new moles or changes in moles, especially if there is a change in a mole's shape or color.  Also tell your health care provider if you have a mole that is larger than the size of a pencil eraser.  Always use sunscreen. Apply sunscreen liberally and repeatedly throughout the day.  Protect yourself by wearing long sleeves, pants, a wide-brimmed hat, and sunglasses whenever you are outside. HEART DISEASE, DIABETES, AND HIGH BLOOD PRESSURE   Have your blood pressure checked at least every 1-2 years. High blood pressure causes heart disease and increases the risk of stroke.  If you are between 55 years and 79 years old, ask your health care provider if you should take aspirin to prevent strokes.  Have regular diabetes screenings. This involves taking a blood  sample to check your fasting blood sugar level.  If you are at a normal weight and have a low risk for diabetes, have this test once every three years after 73 years of age.  If you are overweight and have a high risk for diabetes, consider being tested at a younger age or more often. PREVENTING INFECTION  Hepatitis B  If you have a higher risk for hepatitis B, you should be screened for this virus. You are considered at high risk for hepatitis B if:  You were born in a country where hepatitis B is common. Ask your health care provider which countries are considered high risk.  Your parents were born in a high-risk country, and you have not been immunized against hepatitis B (hepatitis B vaccine).  You have HIV or AIDS.  You use needles to inject street drugs.  You live with someone who has hepatitis B.  You have had sex with someone who has hepatitis B.  You get hemodialysis treatment.  You take certain medicines for conditions, including cancer, organ transplantation, and autoimmune conditions. Hepatitis C  Blood testing is recommended for:  Everyone born from 1945 through 1965.  Anyone with known risk factors for hepatitis C. Sexually transmitted infections (STIs)  You should be screened for sexually transmitted infections (STIs) including gonorrhea and chlamydia if:  You are sexually active and are younger than 73 years of age.  You are older than 73 years of age and your health care provider tells you that you are at risk for this type of infection.  Your sexual activity has changed since you were last screened and you are at an increased risk for chlamydia or gonorrhea. Ask your health care provider if you are at risk.  If you do not have HIV, but are at risk, it may be recommended that you take a prescription medicine daily to prevent HIV infection. This is called pre-exposure prophylaxis (PrEP). You are considered at risk if:  You are sexually active and do not  regularly use condoms or know the HIV status of your partner(s).  You take drugs by injection.  You are sexually active with a partner who has HIV. Talk with your health care provider about whether you are at high risk of being infected with HIV. If you choose to begin PrEP, you should first   be tested for HIV. You should then be tested every 3 months for as long as you are taking PrEP.  PREGNANCY   If you are premenopausal and you may become pregnant, ask your health care provider about preconception counseling.  If you may become pregnant, take 400 to 800 micrograms (mcg) of folic acid every day.  If you want to prevent pregnancy, talk to your health care provider about birth control (contraception). OSTEOPOROSIS AND MENOPAUSE   Osteoporosis is a disease in which the bones lose minerals and strength with aging. This can result in serious bone fractures. Your risk for osteoporosis can be identified using a bone density scan.  If you are 65 years of age or older, or if you are at risk for osteoporosis and fractures, ask your health care provider if you should be screened.  Ask your health care provider whether you should take a calcium or vitamin D supplement to lower your risk for osteoporosis.  Menopause may have certain physical symptoms and risks.  Hormone replacement therapy may reduce some of these symptoms and risks. Talk to your health care provider about whether hormone replacement therapy is right for you.  HOME CARE INSTRUCTIONS   Schedule regular health, dental, and eye exams.  Stay current with your immunizations.   Do not use any tobacco products including cigarettes, chewing tobacco, or electronic cigarettes.  If you are pregnant, do not drink alcohol.  If you are breastfeeding, limit how much and how often you drink alcohol.  Limit alcohol intake to no more than 1 drink per day for nonpregnant women. One drink equals 12 ounces of beer, 5 ounces of wine, or 1  ounces of hard liquor.  Do not use street drugs.  Do not share needles.  Ask your health care provider for help if you need support or information about quitting drugs.  Tell your health care provider if you often feel depressed.  Tell your health care provider if you have ever been abused or do not feel safe at home. Document Released: 03/06/2011 Document Revised: 01/05/2014 Document Reviewed: 07/23/2013 Exit[employer] Patient Information 2015 [employer]. This information is not intended to replace advice given to you by your health care provider. Make sure you discuss any questions you have with your health care provider.  

## 2015-01-14 ENCOUNTER — Encounter: Payer: Self-pay | Admitting: Internal Medicine

## 2015-01-14 DIAGNOSIS — Z0000 Encounter for general adult medical examination without abnormal findings: Secondary | ICD-10-CM | POA: Insufficient documentation

## 2015-01-14 NOTE — Progress Notes (Signed)
   Subjective:    Patient ID: Mary [name], female    DOB: [birth year], 74 y.o.   MRN: [id number]  HPI Here for medicare wellness, no new complaints. Please see A/P for status and treatment of chronic medical problems. Needs to switch her blood pressure medicines due to cost.   Diet: heart healthy Physical activity: sedentary Depression/mood screen: negative Hearing: intact to whispered voice Visual acuity: grossly normal, performs annual eye exam  ADLs: capable Fall risk: none Home safety: good Cognitive evaluation: intact to orientation, naming, recall and repetition EOL planning: adv directives discussed  I have personally reviewed and have noted 1. The patient's medical and social history - reviewed today no changes 2. Their use of alcohol, tobacco or illicit drugs 3. Their current medications and supplements 4. The patient's functional ability including ADL's, fall risks, home safety risks and hearing or visual impairment. 5. Diet and physical activities 6. Evidence for depression or mood disorders 7. Care team reviewed and updated (available in snapshot)  Review of Systems  Constitutional: Negative for fever, activity change, appetite change, fatigue and unexpected weight change.  HENT: Negative.   Eyes: Negative.   Respiratory: Negative for cough, chest tightness, shortness of breath and wheezing.   Cardiovascular: Negative for chest pain, palpitations and leg swelling.  Gastrointestinal: Negative for abdominal pain, diarrhea, constipation and abdominal distention.  Musculoskeletal: Negative.   Skin: Negative.   Neurological: Negative.   Psychiatric/Behavioral: Negative.       Objective:   Physical Exam  Constitutional: She is oriented to person, place, and time. She appears well-developed and well-nourished.  HENT:  Head: Normocephalic and atraumatic.  Eyes: EOM are normal.  Neck: Normal range of motion.  Cardiovascular: Normal rate and regular rhythm.     Pulmonary/Chest: Effort normal and breath sounds normal.  Abdominal: Soft. She exhibits no distension. There is no tenderness.  Musculoskeletal: She exhibits no edema.  Neurological: She is alert and oriented to person, place, and time. Coordination normal.  Skin: Skin is warm and dry.  Psychiatric: She has a normal mood and affect.   Filed Vitals:   01/13/15 0928  BP: 128/88  Pulse: 65  Temp: 98.5 F (36.9 C)  TempSrc: Oral  Resp: 12  Height: 5' (1.524 m)  Weight: 142 lb 12.8 oz (64.774 kg)  SpO2: 96%      Assessment & Plan:

## 2015-01-14 NOTE — Assessment & Plan Note (Signed)
Checking lipid panel, taking zocor 20 mg daily with no side effects. Checking lfts as well.

## 2015-01-14 NOTE — Assessment & Plan Note (Signed)
10 year preventative screening recommendations given to the patient today and discussed with her. She does not wish to get the prevnar today but will think about it. Up to date on other immunizations. Talked with her about advanced directives and she understood.

## 2015-01-14 NOTE — Assessment & Plan Note (Signed)
Changing ziac plus losartan to losartan/hctz and toprol-xl for cost savings. She does not have cardiac indication for the beta blocker and is on solely for blood pressure control. BP controlled today on regimen. Ask her to check at home after switch for possible dose adjustment. Checking labs today and adjust as needed.

## 2015-01-14 NOTE — Assessment & Plan Note (Signed)
Unclear what kind of stones and she has not had trouble in many years. If she has recurrence may need to stop her hctz.

## 2015-01-27 ENCOUNTER — Telehealth: Payer: Self-pay | Admitting: Internal Medicine

## 2015-01-27 NOTE — Telephone Encounter (Signed)
Patient requesting refill for COLCRYS 0.6 MG tablet [[id number]. Pharmacy is [facility] on [address].

## 2015-01-28 ENCOUNTER — Other Ambulatory Visit: Payer: Self-pay | Admitting: Geriatric Medicine

## 2015-01-28 MED ORDER — ACYCLOVIR 400 MG PO TABS: ORAL_TABLET | ORAL | Status: DC

## 2015-01-28 NOTE — Telephone Encounter (Signed)
Left message for patient to call me back. Colcrys is not on the patient's medication list.

## 2015-01-28 NOTE — Telephone Encounter (Signed)
Pt called back in and wanted you to call her back about this when you get a chance

## 2015-01-28 NOTE — Telephone Encounter (Signed)
Patient wanted acyclovir, not colcrys. I sent it to her pharmacy.

## 2015-02-05 ENCOUNTER — Telehealth: Payer: Self-pay

## 2015-02-05 NOTE — Telephone Encounter (Signed)
LVM for pt to call back in regards to scheduling AWV with our health coach.   Patient has an appt scheduled for 09/2015  with [facility] but it is for a follow. Pt est in 12/2014 with [facility].

## 2015-02-05 NOTE — Telephone Encounter (Signed)
error 

## 2015-02-09 ENCOUNTER — Telehealth: Payer: Self-pay

## 2015-02-09 ENCOUNTER — Ambulatory Visit: Payer: Medicare Other

## 2015-02-09 NOTE — Telephone Encounter (Signed)
Spoke to the patient this am and noted that Dr. Doug Sou had completed her AWV in May. I will cancel the apt. I did not the prevnar was due and that she can make a nurse visit or receive it on her next fup.

## 2015-03-03 DIAGNOSIS — M7542 Impingement syndrome of left shoulder: Secondary | ICD-10-CM | POA: Diagnosis not present

## 2015-03-03 DIAGNOSIS — M4807 Spinal stenosis, lumbosacral region: Secondary | ICD-10-CM | POA: Diagnosis not present

## 2015-03-16 DIAGNOSIS — N200 Calculus of kidney: Secondary | ICD-10-CM | POA: Diagnosis not present

## 2015-03-16 DIAGNOSIS — R1110 Vomiting, unspecified: Secondary | ICD-10-CM | POA: Diagnosis not present

## 2015-03-16 DIAGNOSIS — J189 Pneumonia, unspecified organism: Secondary | ICD-10-CM | POA: Diagnosis not present

## 2015-03-16 DIAGNOSIS — K7689 Other specified diseases of liver: Secondary | ICD-10-CM | POA: Diagnosis not present

## 2015-03-16 DIAGNOSIS — N390 Urinary tract infection, site not specified: Secondary | ICD-10-CM | POA: Diagnosis not present

## 2015-03-16 DIAGNOSIS — R109 Unspecified abdominal pain: Secondary | ICD-10-CM | POA: Diagnosis not present

## 2015-03-16 DIAGNOSIS — E780 Pure hypercholesterolemia: Secondary | ICD-10-CM | POA: Diagnosis not present

## 2015-03-16 DIAGNOSIS — I10 Essential (primary) hypertension: Secondary | ICD-10-CM | POA: Diagnosis not present

## 2015-03-23 ENCOUNTER — Ambulatory Visit (INDEPENDENT_AMBULATORY_CARE_PROVIDER_SITE_OTHER)
Admission: RE | Admit: 2015-03-23 | Discharge: 2015-03-23 | Disposition: A | Discharge status: Home / Self Care | Payer: Medicare Other | Source: Ambulatory Visit | Attending: Internal Medicine | Admitting: Internal Medicine

## 2015-03-23 ENCOUNTER — Encounter: Payer: Self-pay | Admitting: Internal Medicine

## 2015-03-23 ENCOUNTER — Other Ambulatory Visit: Payer: Self-pay | Admitting: Internal Medicine

## 2015-03-23 ENCOUNTER — Ambulatory Visit (INDEPENDENT_AMBULATORY_CARE_PROVIDER_SITE_OTHER): Payer: Medicare Other | Admitting: Internal Medicine

## 2015-03-23 VITALS — BP 142/90 | HR 77 | Temp 98.1°F | Resp 16 | Wt 142.0 lb

## 2015-03-23 DIAGNOSIS — R112 Nausea with vomiting, unspecified: Secondary | ICD-10-CM | POA: Diagnosis not present

## 2015-03-23 DIAGNOSIS — R938 Abnormal findings on diagnostic imaging of other specified body structures: Secondary | ICD-10-CM

## 2015-03-23 DIAGNOSIS — Z87442 Personal history of urinary calculi: Secondary | ICD-10-CM

## 2015-03-23 DIAGNOSIS — J9819 Other pulmonary collapse: Secondary | ICD-10-CM | POA: Insufficient documentation

## 2015-03-23 DIAGNOSIS — R109 Unspecified abdominal pain: Secondary | ICD-10-CM | POA: Diagnosis not present

## 2015-03-23 DIAGNOSIS — I10 Essential (primary) hypertension: Secondary | ICD-10-CM | POA: Diagnosis not present

## 2015-03-23 DIAGNOSIS — R9389 Abnormal findings on diagnostic imaging of other specified body structures: Secondary | ICD-10-CM

## 2015-03-23 NOTE — Patient Instructions (Signed)
  Your next office appointment will be determined based upon review of your pending  xrays  Those written interpretation of the lab results and instructions will be transmitted to you by mail for your records.  Critical results will be called.   Followup as needed for any active or acute issue. Please report any significant change in your symptoms.

## 2015-03-23 NOTE — Progress Notes (Signed)
Pre visit review using our clinic review tool, if applicable. No additional management support is needed unless otherwise documented below in the visit note. 

## 2015-03-23 NOTE — Progress Notes (Signed)
   Subjective:    Patient ID: Mary [name], female    DOB: 11-14-1940, 74 y.o.   MRN: [id number]  HPI She is here in follow-up from the emergency room visit to [facility] in [place], [place]. Her symptoms began with right flank pain and nausea and vomiting. [place] the beach and vomited twice and then went to the emergency room where she stayed for 5 hours. She vomited twice while there. She was diagnosed with right middle lobe pneumonia as well as urinary tract infection.  Evaluation included CT of the abdomen and pelvis, CBC and differential, basic metabolic profile, and urine culture.  Surprisingly prior to the onset of symptoms she had had no upper or lower respiratory tract infection symptoms. She also had no extrinsic symptoms. Genitourinary symptoms also are absent.  Her only symptom at this time is a malaise and fatigue since the onset of symptoms. She has finished the Z-Pak. She was given oxycodone for the flank pain but had profound somnolence with the first dose and has not taken anymore.  She has a history of having stones removed in the context of bladder sling surgery possibly in 2011 by Dr. Risa Grill. She stated the pain she had @ that time was similar to this pain only much more severe.  PMH of pulmonary nodule as per Dr Lenna Gilford.Never smoked.  Review of Systems Frontal headache, facial pain , nasal purulence, dental pain, sore throat , otic pain or otic discharge denied. No fever , chills or sweats. Extrinsic symptoms of itchy, watery eyes, sneezing, or angioedema are denied. There is no significant cough, sputum production, wheezing,or  paroxysmal nocturnal dyspnea.No pleuritic chest pain. Dysuria, pyuria, hematuria, frequency, nocturia or polyuria are denied.    Objective:   Physical Exam  Pertinent or positive findings include: Punctate erythema right tympanic membrane. Mild erythema of the nasal mucosa, right greater than left.  Papular keratotic skin lesions of the extremities.  General appearance :adequately nourished; in no distress.  Eyes: No conjunctival inflammation or scleral icterus is present.  Oral exam:  Lips and gums are healthy appearing.There is no oropharyngeal erythema or exudate noted. Dental hygiene is good.  Heart:  Normal rate and regular rhythm. S1 and S2 normal without gallop, murmur, click, rub or other extra sounds    Lungs:Chest clear to auscultation; no wheezes, rhonchi,rales ,or rubs present.No increased work of breathing.   Abdomen: bowel sounds normal, soft and non-tender without masses, organomegaly or hernias noted.  No guarding or rebound. No flank tenderness to percussion.  Vascular : all pulses equal ; no bruits present.  Skin:Warm & dry.  Intact without suspicious lesions or rashes ; no tenting or jaundice   Lymphatic: No lymphadenopathy is noted about the head, neck, axilla.   Neuro: Strength, tone & DTRs normal.        Assessment & Plan:  #1 nausea and vomiting  #2 right flank pain  #3 history of kidney stones. This episode most likely represents passage of a small stone.  #4 possible community-acquired, right middle lobe pneumonia. X-ray will be repeated; it is possible the right middle lobe syndrome was mistaken for community acquired pneumonia.  Records from the hospital will be obtained for review

## 2015-03-26 ENCOUNTER — Telehealth: Payer: Self-pay | Admitting: Internal Medicine

## 2015-03-26 NOTE — Telephone Encounter (Signed)
Received records from [facility] forwarded to Dr. Unice Cobble 7 pages 03/26/15 fbg.

## 2015-04-02 ENCOUNTER — Ambulatory Visit (INDEPENDENT_AMBULATORY_CARE_PROVIDER_SITE_OTHER): Payer: Medicare Other | Admitting: Internal Medicine

## 2015-04-02 ENCOUNTER — Encounter: Payer: Self-pay | Admitting: Internal Medicine

## 2015-04-02 ENCOUNTER — Other Ambulatory Visit: Payer: Medicare Other

## 2015-04-02 VITALS — BP 148/90 | HR 81 | Temp 98.0°F | Resp 14 | Ht 60.0 in | Wt 142.0 lb

## 2015-04-02 DIAGNOSIS — R300 Dysuria: Secondary | ICD-10-CM

## 2015-04-02 DIAGNOSIS — R109 Unspecified abdominal pain: Secondary | ICD-10-CM | POA: Insufficient documentation

## 2015-04-02 LAB — POCT URINALYSIS DIPSTICK
BILIRUBIN UA: NEGATIVE
Glucose, UA: NEGATIVE
KETONES UA: NEGATIVE
NITRITE UA: NEGATIVE
PROTEIN UA: NEGATIVE
RBC UA: NEGATIVE
Spec Grav, UA: 1.015
Urobilinogen, UA: 0.2
pH, UA: 8

## 2015-04-02 MED ORDER — ONDANSETRON HCL 4 MG PO TABS: 4.0000 mg | ORAL_TABLET | Freq: Three times a day (TID) | ORAL | Status: DC | PRN

## 2015-04-02 MED ORDER — NITROFURANTOIN MONOHYD MACRO 100 MG PO CAPS: 100.0000 mg | ORAL_CAPSULE | Freq: Two times a day (BID) | ORAL | Status: DC

## 2015-04-02 NOTE — Progress Notes (Signed)
Pre visit review using our clinic review tool, if applicable. No additional management support is needed unless otherwise documented below in the visit note. 

## 2015-04-02 NOTE — Patient Instructions (Signed)
We do have signs of infection on the urine so we will treat you with an antibiotic called macrobid. Take 1 pill twice a day for 1 week.   We will also check an ultrasound of the stomach to see if there is another cause. This would show [place] if the kidneys are backed up or the liver or gallbladder is having any problems.   You can keep using the oxycodone for pain if needed. We have also sent in a medicine called zofran for nausea that you can take.

## 2015-04-02 NOTE — Progress Notes (Signed)
   Subjective:    Patient ID: Mary [name], female    DOB: 1941-07-07, 74 y.o.   MRN: [id number]  HPI The patient is a 74 YO female coming in for right flank pain and nausea with vomiting. She had pain starting yesterday. Nausea with vomiting this morning after eating. Has been able to tolerate food and liquids since then. Does have history of kidney stones but does not feel that this pain is comparable to that. Pain 6/10 now and steady. She had a problem similar at the beach. She was evaluated with CT then but she does not remember what the results were. She was treated for pneumonia at that time. No blood in her urine and no pain on urination.   Review of Systems  Constitutional: Positive for appetite change. Negative for fever, activity change, fatigue and unexpected weight change.  Respiratory: Negative for cough, chest tightness, shortness of breath and wheezing.   Cardiovascular: Negative for chest pain, palpitations and leg swelling.  Gastrointestinal: Positive for nausea, vomiting and abdominal pain. Negative for diarrhea, constipation, blood in stool and abdominal distention.  Genitourinary: Positive for flank pain. Negative for dysuria, frequency and hematuria.  Musculoskeletal: Positive for back pain.  Skin: Negative.       Objective:   Physical Exam  Constitutional: She appears well-developed and well-nourished.  HENT:  Head: Normocephalic and atraumatic.  Eyes: EOM are normal.  Neck: Normal range of motion.  Cardiovascular: Normal rate and regular rhythm.   Pulmonary/Chest: Effort normal and breath sounds normal. No respiratory distress. She has no wheezes.  Abdominal: Soft. She exhibits no distension. There is no tenderness. There is no rebound and no guarding.  Right flank pain, no radiation, no tenderness in the front stomach  Skin: Skin is warm and dry.   Filed Vitals:   04/02/15 1023  BP: 148/90  Pulse: 81  Temp: 98 F (36.7 C)  TempSrc: Oral  Resp: 14    Height: 5' (1.524 m)  Weight: 142 lb (64.411 kg)  SpO2: 98%      Assessment & Plan:

## 2015-04-02 NOTE — Assessment & Plan Note (Addendum)
Most likely to be related to her past kidney stones. U/A with signs of infection and will treat and send for culture. Checking US abdomen as well to ensure no hydronephrosis from a kidney stone. Zofran for nausea.

## 2015-04-03 LAB — URINE CULTURE
COLONY COUNT: NO GROWTH
Organism ID, Bacteria: NO GROWTH

## 2015-04-07 DIAGNOSIS — M4807 Spinal stenosis, lumbosacral region: Secondary | ICD-10-CM | POA: Diagnosis not present

## 2015-04-08 ENCOUNTER — Ambulatory Visit
Admission: RE | Admit: 2015-04-08 | Discharge: 2015-04-08 | Disposition: A | Discharge status: Home / Self Care | Payer: Medicare Other | Source: Ambulatory Visit | Attending: Internal Medicine | Admitting: Internal Medicine

## 2015-04-08 DIAGNOSIS — R300 Dysuria: Secondary | ICD-10-CM

## 2015-04-08 DIAGNOSIS — N200 Calculus of kidney: Secondary | ICD-10-CM | POA: Diagnosis not present

## 2015-04-08 DIAGNOSIS — N281 Cyst of kidney, acquired: Secondary | ICD-10-CM | POA: Diagnosis not present

## 2015-04-08 DIAGNOSIS — K7689 Other specified diseases of liver: Secondary | ICD-10-CM | POA: Diagnosis not present

## 2015-04-19 ENCOUNTER — Telehealth: Payer: Self-pay | Admitting: Internal Medicine

## 2015-04-19 NOTE — Telephone Encounter (Signed)
Please inform patient that he has a kidney stone of about 62mm which is a fairly significant size. I would recommend a follow up with urology. We can also start her on a drug called Flomax which may help with passage if she would like.

## 2015-04-19 NOTE — Telephone Encounter (Signed)
Patient had imaging done on 04/08/2015 which has not been interpreted. Patient is following up. Advised that we would have another MD to look and we will reach out.

## 2015-04-19 NOTE — Telephone Encounter (Signed)
Are you ok with me calling patient to advise that she has a small kidney stone, no blockage and all other US findings are normal?  Please advise, thanks

## 2015-04-19 NOTE — Telephone Encounter (Signed)
Patient already sees dr Jocelyn Lamer ([facility]) and will follow up with him, patient states she is not having any pain right now

## 2015-06-01 ENCOUNTER — Ambulatory Visit (INDEPENDENT_AMBULATORY_CARE_PROVIDER_SITE_OTHER): Payer: Medicare Other

## 2015-06-01 DIAGNOSIS — N200 Calculus of kidney: Secondary | ICD-10-CM | POA: Diagnosis not present

## 2015-06-01 DIAGNOSIS — Z23 Encounter for immunization: Secondary | ICD-10-CM

## 2015-06-07 DIAGNOSIS — H25099 Other age-related incipient cataract, unspecified eye: Secondary | ICD-10-CM | POA: Diagnosis not present

## 2015-06-17 DIAGNOSIS — D2371 Other benign neoplasm of skin of right lower limb, including hip: Secondary | ICD-10-CM | POA: Diagnosis not present

## 2015-06-17 DIAGNOSIS — M2041 Other hammer toe(s) (acquired), right foot: Secondary | ICD-10-CM | POA: Diagnosis not present

## 2015-06-17 DIAGNOSIS — M79671 Pain in right foot: Secondary | ICD-10-CM | POA: Diagnosis not present

## 2015-06-24 DIAGNOSIS — L03031 Cellulitis of right toe: Secondary | ICD-10-CM | POA: Diagnosis not present

## 2015-07-09 ENCOUNTER — Other Ambulatory Visit: Payer: Self-pay | Admitting: Internal Medicine

## 2015-09-15 ENCOUNTER — Other Ambulatory Visit (INDEPENDENT_AMBULATORY_CARE_PROVIDER_SITE_OTHER): Payer: PPO

## 2015-09-15 ENCOUNTER — Encounter: Payer: Self-pay | Admitting: Internal Medicine

## 2015-09-15 ENCOUNTER — Ambulatory Visit (INDEPENDENT_AMBULATORY_CARE_PROVIDER_SITE_OTHER): Payer: PPO | Admitting: Internal Medicine

## 2015-09-15 VITALS — BP 136/76 | HR 88 | Temp 98.6°F | Resp 12 | Ht 60.0 in | Wt 142.4 lb

## 2015-09-15 DIAGNOSIS — Z23 Encounter for immunization: Secondary | ICD-10-CM

## 2015-09-15 DIAGNOSIS — I10 Essential (primary) hypertension: Secondary | ICD-10-CM

## 2015-09-15 DIAGNOSIS — R7303 Prediabetes: Secondary | ICD-10-CM

## 2015-09-15 DIAGNOSIS — J069 Acute upper respiratory infection, unspecified: Secondary | ICD-10-CM

## 2015-09-15 LAB — HEMOGLOBIN A1C: Hgb A1c MFr Bld: 6.6 % — ABNORMAL HIGH (ref 4.6–6.5)

## 2015-09-15 LAB — COMPREHENSIVE METABOLIC PANEL
ALK PHOS: 71 U/L (ref 39–117)
ALT: 24 U/L (ref 0–35)
AST: 27 U/L (ref 0–37)
Albumin: 4.2 g/dL (ref 3.5–5.2)
BILIRUBIN TOTAL: 0.5 mg/dL (ref 0.2–1.2)
BUN: 20 mg/dL (ref 6–23)
CALCIUM: 9.9 mg/dL (ref 8.4–10.5)
CO2: 30 mEq/L (ref 19–32)
Chloride: 101 mEq/L (ref 96–112)
Creatinine, Ser: 1.1 mg/dL (ref 0.40–1.20)
GFR: 51.71 mL/min — AB (ref >60.00)
GLUCOSE: 130 mg/dL — AB (ref 70–99)
POTASSIUM: 3.7 meq/L (ref 3.5–5.1)
Sodium: 140 mEq/L (ref 135–145)
Total Protein: 7.2 g/dL (ref 6.0–8.3)

## 2015-09-15 MED ORDER — HYDROCODONE-HOMATROPINE 5-1.5 MG/5ML PO SYRP: 5.0000 mL | ORAL_SOLUTION | Freq: Three times a day (TID) | ORAL | Status: DC | PRN

## 2015-09-15 NOTE — Assessment & Plan Note (Signed)
Checking HgA1c today, last several years ago 6.3. Not diabetic and has not been in the past.

## 2015-09-15 NOTE — Progress Notes (Signed)
Pre visit review using our clinic review tool, if applicable. No additional management support is needed unless otherwise documented below in the visit note. 

## 2015-09-15 NOTE — Patient Instructions (Signed)
We will check the blood work today and call you back about the results.   You do not need any antibiotics today and we have given you a cough syrup that you can use at night time for the cough.   Upper Respiratory Infection, Adult Most upper respiratory infections (URIs) are a viral infection of the air passages leading to the lungs. A URI affects the nose, throat, and upper air passages. The most common type of URI is nasopharyngitis and is typically referred to as "the common cold." URIs run their course and usually go away on their own. Most of the time, a URI does not require medical attention, but sometimes a bacterial infection in the upper airways can follow a viral infection. This is called a secondary infection. Sinus and middle ear infections are common types of secondary upper respiratory infections. Bacterial pneumonia can also complicate a URI. A URI can worsen asthma and chronic obstructive pulmonary disease (COPD). Sometimes, these complications can require emergency medical care and may be life threatening.  CAUSES Almost all URIs are caused by viruses. A virus is a type of germ and can spread from one person to another.  RISKS FACTORS You may be at risk for a URI if:   You smoke.   You have chronic heart or lung disease.  You have a weakened defense (immune) system.   You are very young or very old.   You have nasal allergies or asthma.  You work in crowded or poorly ventilated areas.  You work in health care facilities or schools. SIGNS AND SYMPTOMS  Symptoms typically develop 2-3 days after you come in contact with a cold virus. Most viral URIs last 7-10 days. However, viral URIs from the influenza virus (flu virus) can last 14-18 days and are typically more severe. Symptoms may include:   Runny or stuffy (congested) nose.   Sneezing.   Cough.   Sore throat.   Headache.   Fatigue.   Fever.   Loss of appetite.   Pain in your forehead, behind  your eyes, and over your cheekbones (sinus pain).  Muscle aches.  DIAGNOSIS  Your health care provider may diagnose a URI by:  Physical exam.  Tests to check that your symptoms are not due to another condition such as:  Strep throat.  Sinusitis.  Pneumonia.  Asthma. TREATMENT  A URI goes away on its own with time. It cannot be cured with medicines, but medicines may be prescribed or recommended to relieve symptoms. Medicines may help:  Reduce your fever.  Reduce your cough.  Relieve nasal congestion. HOME CARE INSTRUCTIONS   Take medicines only as directed by your health care provider.   Gargle warm saltwater or take cough drops to comfort your throat as directed by your health care provider.  Use a warm mist humidifier or inhale steam from a shower to increase air moisture. This may make it easier to breathe.  Drink enough fluid to keep your urine clear or pale yellow.   Eat soups and other clear broths and maintain good nutrition.   Rest as needed.   Return to work when your temperature has returned to normal or as your health care provider advises. You may need to stay home longer to avoid infecting others. You can also use a face mask and careful hand washing to prevent spread of the virus.  Increase the usage of your inhaler if you have asthma.   Do not use any tobacco products, including cigarettes,  chewing tobacco, or electronic cigarettes. If you need help quitting, ask your health care provider. PREVENTION  The best way to protect yourself from getting a cold is to practice good hygiene.   Avoid oral or hand contact with people with cold symptoms.   Wash your hands often if contact occurs.  There is no clear evidence that vitamin C, vitamin E, echinacea, or exercise reduces the chance of developing a cold. However, it is always recommended to get plenty of rest, exercise, and practice good nutrition.  SEEK MEDICAL CARE IF:   You are getting worse  rather than better.   Your symptoms are not controlled by medicine.   You have chills.  You have worsening shortness of breath.  You have brown or red mucus.  You have yellow or brown nasal discharge.  You have pain in your face, especially when you bend forward.  You have a fever.  You have swollen neck glands.  You have pain while swallowing.  You have white areas in the back of your throat. SEEK IMMEDIATE MEDICAL CARE IF:   You have severe or persistent:  Headache.  Ear pain.  Sinus pain.  Chest pain.  You have chronic lung disease and any of the following:  Wheezing.  Prolonged cough.  Coughing up blood.  A change in your usual mucus.  You have a stiff neck.  You have changes in your:  Vision.  Hearing.  Thinking.  Mood. MAKE SURE YOU:   Understand these instructions.  Will watch your condition.  Will get help right away if you are not doing well or get worse.   This information is not intended to replace advice given to you by your health care provider. Make sure you discuss any questions you have with your health care provider.   Document Released: 02/14/2001 Document Revised: 01/05/2015 Document Reviewed: 11/26/2013 Elsevier Interactive Patient Education [employer].

## 2015-09-15 NOTE — Addendum Note (Signed)
Addended by: Resa Miner R on: 09/15/2015 05:09 PM   Modules accepted: Orders

## 2015-09-15 NOTE — Assessment & Plan Note (Signed)
Checking CMP today for changes made last visit. She is taking losartan/hctz and toprol-xl. No side effects from medications. BP at goal.

## 2015-09-15 NOTE — Assessment & Plan Note (Signed)
Rx for hycodan given at visit for symptoms and talked to her about course.

## 2015-09-15 NOTE — Progress Notes (Signed)
   Subjective:    Patient ID: Mary [name], female    DOB: 1941/06/27, 75 y.o.   MRN: [id number]  HPI The patient is a 75 YO female coming in for follow up of her blood pressure. Last visit we did change some of her medicines for cost. She is doing well with those and no side effects. Denies headaches, chest pains, SOB.  She is also having a cold right now. Started last Friday. Some fevers and chills the first two days. Now she is having some sinus congestion. Mild cough which is keeping her up at night. Overall feels she is improving. Not taking anything over the counter.   Review of Systems  Constitutional: Negative for fever, activity change, appetite change, fatigue and unexpected weight change.  HENT: Positive for congestion and rhinorrhea. Negative for facial swelling, hearing loss, postnasal drip, sinus pressure, sore throat and trouble swallowing.   Eyes: Negative.   Respiratory: Positive for cough. Negative for chest tightness, shortness of breath and wheezing.   Cardiovascular: Negative for chest pain, palpitations and leg swelling.  Gastrointestinal: Negative for nausea, vomiting, abdominal pain, diarrhea, constipation, blood in stool and abdominal distention.  Skin: Negative.       Objective:   Physical Exam  Constitutional: She is oriented to person, place, and time. She appears well-developed and well-nourished.  HENT:  Head: Normocephalic and atraumatic.  Oropharynx with mild erythema and clear drainage, nose without crusting.   Eyes: EOM are normal.  Neck: Normal range of motion.  Cardiovascular: Normal rate and regular rhythm.   Pulmonary/Chest: Effort normal and breath sounds normal. No respiratory distress. She has no wheezes. She has no rales.  Abdominal: Soft. She exhibits no distension. There is no tenderness.  Musculoskeletal: She exhibits no edema.  Neurological: She is alert and oriented to person, place, and time.  Skin: Skin is warm and dry.   Filed  Vitals:   09/15/15 0801  BP: 136/76  Pulse: 88  Temp: 98.6 F (37 C)  TempSrc: Oral  Resp: 12  Height: 5' (1.524 m)  Weight: 142 lb 6.4 oz (64.592 kg)  SpO2: 96%      Assessment & Plan:  Prevnar 13 given at visit.

## 2015-10-07 ENCOUNTER — Other Ambulatory Visit: Payer: Self-pay | Admitting: Internal Medicine

## 2015-12-21 ENCOUNTER — Encounter: Payer: Self-pay | Admitting: Gastroenterology

## 2016-01-05 DIAGNOSIS — D225 Melanocytic nevi of trunk: Secondary | ICD-10-CM | POA: Diagnosis not present

## 2016-01-05 DIAGNOSIS — B078 Other viral warts: Secondary | ICD-10-CM | POA: Diagnosis not present

## 2016-01-05 DIAGNOSIS — L821 Other seborrheic keratosis: Secondary | ICD-10-CM | POA: Diagnosis not present

## 2016-01-14 ENCOUNTER — Other Ambulatory Visit: Payer: Self-pay | Admitting: Internal Medicine

## 2016-01-21 ENCOUNTER — Other Ambulatory Visit: Payer: Self-pay | Admitting: Internal Medicine

## 2016-02-28 DIAGNOSIS — M5136 Other intervertebral disc degeneration, lumbar region: Secondary | ICD-10-CM | POA: Diagnosis not present

## 2016-02-28 DIAGNOSIS — M4807 Spinal stenosis, lumbosacral region: Secondary | ICD-10-CM | POA: Diagnosis not present

## 2016-02-28 DIAGNOSIS — M5416 Radiculopathy, lumbar region: Secondary | ICD-10-CM | POA: Diagnosis not present

## 2016-03-14 ENCOUNTER — Encounter: Payer: Self-pay | Admitting: Internal Medicine

## 2016-03-14 ENCOUNTER — Other Ambulatory Visit (INDEPENDENT_AMBULATORY_CARE_PROVIDER_SITE_OTHER): Payer: PPO

## 2016-03-14 ENCOUNTER — Ambulatory Visit (INDEPENDENT_AMBULATORY_CARE_PROVIDER_SITE_OTHER): Payer: PPO | Admitting: Internal Medicine

## 2016-03-14 VITALS — BP 160/90 | HR 72 | Temp 98.5°F | Resp 16 | Ht 60.0 in | Wt 145.0 lb

## 2016-03-14 DIAGNOSIS — R7989 Other specified abnormal findings of blood chemistry: Secondary | ICD-10-CM

## 2016-03-14 DIAGNOSIS — Z0000 Encounter for general adult medical examination without abnormal findings: Secondary | ICD-10-CM

## 2016-03-14 DIAGNOSIS — E7800 Pure hypercholesterolemia, unspecified: Secondary | ICD-10-CM

## 2016-03-14 DIAGNOSIS — R7303 Prediabetes: Secondary | ICD-10-CM | POA: Diagnosis not present

## 2016-03-14 DIAGNOSIS — I10 Essential (primary) hypertension: Secondary | ICD-10-CM | POA: Diagnosis not present

## 2016-03-14 LAB — LIPID PANEL
CHOL/HDL RATIO: 4
CHOLESTEROL: 210 mg/dL — AB (ref 0–200)
HDL: 54.9 mg/dL (ref >39.00)
NonHDL: 155.28
Triglycerides: 243 mg/dL — ABNORMAL HIGH (ref 0.0–149.0)
VLDL: 48.6 mg/dL — AB (ref 0.0–40.0)

## 2016-03-14 LAB — HEMOGLOBIN A1C: Hgb A1c MFr Bld: 6.3 % (ref 4.6–6.5)

## 2016-03-14 LAB — CBC
HCT: 43 % (ref 36.0–46.0)
Hemoglobin: 14.7 g/dL (ref 12.0–15.0)
MCHC: 34.2 g/dL (ref 30.0–36.0)
MCV: 90.2 fl (ref 78.0–100.0)
PLATELETS: 285 10*3/uL (ref 150.0–400.0)
RBC: 4.78 Mil/uL (ref 3.87–5.11)
RDW: 12.8 % (ref 11.5–15.5)
WBC: 11.7 10*3/uL — ABNORMAL HIGH (ref 4.0–10.5)

## 2016-03-14 LAB — COMPREHENSIVE METABOLIC PANEL
ALK PHOS: 86 U/L (ref 39–117)
ALT: 22 U/L (ref 0–35)
AST: 23 U/L (ref 0–37)
Albumin: 4.3 g/dL (ref 3.5–5.2)
BILIRUBIN TOTAL: 0.6 mg/dL (ref 0.2–1.2)
BUN: 19 mg/dL (ref 6–23)
CALCIUM: 10.9 mg/dL — AB (ref 8.4–10.5)
CO2: 28 mEq/L (ref 19–32)
CREATININE: 0.94 mg/dL (ref 0.40–1.20)
Chloride: 101 mEq/L (ref 96–112)
GFR: 61.91 mL/min (ref >60.00)
GLUCOSE: 114 mg/dL — AB (ref 70–99)
Potassium: 3.8 mEq/L (ref 3.5–5.1)
Sodium: 138 mEq/L (ref 135–145)
TOTAL PROTEIN: 7.5 g/dL (ref 6.0–8.3)

## 2016-03-14 LAB — LDL CHOLESTEROL, DIRECT: LDL DIRECT: 130 mg/dL

## 2016-03-14 MED ORDER — ACYCLOVIR 400 MG PO TABS: ORAL_TABLET | ORAL | Status: DC

## 2016-03-14 NOTE — Assessment & Plan Note (Signed)
Mammogram reminder given today. Checking labs, counseled on the dangers of distracted driving. Also counseled on sun safety and mole surveillance. Needs more exercise. Given 10 year screening recommendations. Colonoscopy up to date and immunizations up to date.

## 2016-03-14 NOTE — Assessment & Plan Note (Signed)
BP above goal today but normally is normal. Taking losartan/hctz and metoprolol and check CMP and adjust as needed.

## 2016-03-14 NOTE — Progress Notes (Signed)
Pre visit review using our clinic review tool, if applicable. No additional management support is needed unless otherwise documented below in the visit note. 

## 2016-03-14 NOTE — Assessment & Plan Note (Signed)
Checking HgA1c and adjust as needed. She has cut back on sweet beverages since last visit.

## 2016-03-14 NOTE — Progress Notes (Signed)
   Subjective:    Patient ID: Mary [name], female    DOB: 1940-10-20, 75 y.o.   MRN: [id number]  HPI Here for medicare wellness, no new complaints. Please see A/P for status and treatment of chronic medical problems.   Diet: mediocre Physical activity: sedentary due to low back pain Depression/mood screen: negative Hearing: intact to whispered voice Visual acuity: grossly normal, performs annual eye exam  ADLs: capable Fall risk: none Home safety: good Cognitive evaluation: intact to orientation, naming, recall and repetition EOL planning: adv directives discussed  I have personally reviewed and have noted 1. The patient's medical and social history - reviewed today no changes 2. Their use of alcohol, tobacco or illicit drugs 3. Their current medications and supplements 4. The patient's functional ability including ADL's, fall risks, home safety risks and hearing or visual impairment. 5. Diet and physical activities 6. Evidence for depression or mood disorders 7. Care team reviewed and updated (available in snapshot)  Review of Systems  Constitutional: Negative for fever, activity change, appetite change, fatigue and unexpected weight change.  HENT: Negative for congestion, facial swelling, hearing loss, postnasal drip, rhinorrhea, sinus pressure, sore throat and trouble swallowing.   Eyes: Negative.   Respiratory: Negative for cough, chest tightness, shortness of breath and wheezing.   Cardiovascular: Negative for chest pain, palpitations and leg swelling.  Gastrointestinal: Negative for nausea, vomiting, abdominal pain, diarrhea, constipation, blood in stool and abdominal distention.  Musculoskeletal: Positive for back pain and arthralgias. Negative for myalgias and gait problem.  Skin: Negative.   Neurological: Negative.   Psychiatric/Behavioral: Negative.       Objective:   Physical Exam  Constitutional: She is oriented to person, place, and time. She appears  well-developed and well-nourished.  HENT:  Head: Normocephalic and atraumatic.  Right Ear: External ear normal.  Left Ear: External ear normal.  Eyes: EOM are normal.  Neck: Normal range of motion.  Cardiovascular: Normal rate and regular rhythm.   Pulmonary/Chest: Effort normal and breath sounds normal. No respiratory distress. She has no wheezes. She has no rales.  Abdominal: Soft. Bowel sounds are normal. She exhibits no distension. There is no tenderness.  Musculoskeletal: She exhibits no edema.  Neurological: She is alert and oriented to person, place, and time.  Skin: Skin is warm and dry.  Psychiatric: She has a normal mood and affect.   Filed Vitals:   03/14/16 0934  BP: 160/90  Pulse: 72  Temp: 98.5 F (36.9 C)  TempSrc: Oral  Resp: 16  Height: 5' (1.524 m)  Weight: 145 lb (65.772 kg)  SpO2: 97%      Assessment & Plan:

## 2016-03-14 NOTE — Assessment & Plan Note (Signed)
Taking simvastatin 20 mg daily and checking lipid panel. Adjust as needed.

## 2016-03-14 NOTE — Patient Instructions (Signed)
We will check the blood work today and call you back with the results.   Health Maintenance, Female Adopting a healthy lifestyle and getting preventive care can go a long way to promote health and wellness. Talk with your health care provider about what schedule of regular examinations is right for you. This is a good chance for you to check in with your provider about disease prevention and staying healthy. In between checkups, there are plenty of things you can do on your own. Experts have done a lot of research about which lifestyle changes and preventive measures are most likely to keep you healthy. Ask your health care provider for more information. WEIGHT AND DIET  Eat a healthy diet  Be sure to include plenty of vegetables, fruits, low-fat dairy products, and lean protein.  Do not eat a lot of foods high in solid fats, added sugars, or salt.  Get regular exercise. This is one of the most important things you can do for your health.  Most adults should exercise for at least 150 minutes each week. The exercise should increase your heart rate and make you sweat (moderate-intensity exercise).  Most adults should also do strengthening exercises at least twice a week. This is in addition to the moderate-intensity exercise.  Maintain a healthy weight  Body mass index (BMI) is a measurement that can be used to identify possible weight problems. It estimates body fat based on height and weight. Your health care provider can help determine your BMI and help you achieve or maintain a healthy weight.  For females 9 years of age and older:   A BMI below 18.5 is considered underweight.  A BMI of 18.5 to 24.9 is normal.  A BMI of 25 to 29.9 is considered overweight.  A BMI of 30 and above is considered obese.  Watch levels of cholesterol and blood lipids  You should start having your blood tested for lipids and cholesterol at 74 years of age, then have this test every 5 years.  You may  need to have your cholesterol levels checked more often if:  Your lipid or cholesterol levels are high.  You are older than 74 years of age.  You are at high risk for heart disease.  CANCER SCREENING   Lung Cancer  Lung cancer screening is recommended for adults 68-6 years old who are at high risk for lung cancer because of a history of smoking.  A yearly low-dose CT scan of the lungs is recommended for people who:  Currently smoke.  Have quit within the past 15 years.  Have at least a 30-pack-year history of smoking. A pack year is smoking an average of one pack of cigarettes a day for 1 year.  Yearly screening should continue until it has been 15 years since you quit.  Yearly screening should stop if you develop a health problem that would prevent you from having lung cancer treatment.  Breast Cancer  Practice breast self-awareness. This means understanding how your breasts normally appear and feel.  It also means doing regular breast self-exams. Let your health care provider know about any changes, no matter how small.  If you are in your 20s or 30s, you should have a clinical breast exam (CBE) by a health care provider every 1-3 years as part of a regular health exam.  If you are 24 or older, have a CBE every year. Also consider having a breast X-ray (mammogram) every year.  If you have a family  history of breast cancer, talk to your health care provider about genetic screening.  If you are at high risk for breast cancer, talk to your health care provider about having an MRI and a mammogram every year.  Breast cancer gene (BRCA) assessment is recommended for women who have family members with BRCA-related cancers. BRCA-related cancers include:  Breast.  Ovarian.  Tubal.  Peritoneal cancers.  Results of the assessment will determine the need for genetic counseling and BRCA1 and BRCA2 testing. Cervical Cancer Your health care provider may recommend that you be  screened regularly for cancer of the pelvic organs (ovaries, uterus, and vagina). This screening involves a pelvic examination, including checking for microscopic changes to the surface of your cervix (Pap test). You may be encouraged to have this screening done every 3 years, beginning at age 38.  For women ages 83-65, health care providers may recommend pelvic exams and Pap testing every 3 years, or they may recommend the Pap and pelvic exam, combined with testing for human papilloma virus (HPV), every 5 years. Some types of HPV increase your risk of cervical cancer. Testing for HPV may also be done on women of any age with unclear Pap test results.  Other health care providers may not recommend any screening for nonpregnant women who are considered low risk for pelvic cancer and who do not have symptoms. Ask your health care provider if a screening pelvic exam is right for you.  If you have had past treatment for cervical cancer or a condition that could lead to cancer, you need Pap tests and screening for cancer for at least 20 years after your treatment. If Pap tests have been discontinued, your risk factors (such as having a new sexual partner) need to be reassessed to determine if screening should resume. Some women have medical problems that increase the chance of getting cervical cancer. In these cases, your health care provider may recommend more frequent screening and Pap tests. Colorectal Cancer  This type of cancer can be detected and often prevented.  Routine colorectal cancer screening usually begins at 74 years of age and continues through 74 years of age.  Your health care provider may recommend screening at an earlier age if you have risk factors for colon cancer.  Your health care provider may also recommend using home test kits to check for hidden blood in the stool.  A small camera at the end of a tube can be used to examine your colon directly (sigmoidoscopy or colonoscopy).  This is done to check for the earliest forms of colorectal cancer.  Routine screening usually begins at age 52.  Direct examination of the colon should be repeated every 5-10 years through 74 years of age. However, you may need to be screened more often if early forms of precancerous polyps or small growths are found. Skin Cancer  Check your skin from head to toe regularly.  Tell your health care provider about any new moles or changes in moles, especially if there is a change in a mole's shape or color.  Also tell your health care provider if you have a mole that is larger than the size of a pencil eraser.  Always use sunscreen. Apply sunscreen liberally and repeatedly throughout the day.  Protect yourself by wearing long sleeves, pants, a wide-brimmed hat, and sunglasses whenever you are outside. HEART DISEASE, DIABETES, AND HIGH BLOOD PRESSURE   High blood pressure causes heart disease and increases the risk of stroke. High blood pressure  is more likely to develop in:  People who have blood pressure in the high end of the normal range (130-139/85-89 mm Hg).  People who are overweight or obese.  People who are African American.  If you are 105-22 years of age, have your blood pressure checked every 3-5 years. If you are 70 years of age or older, have your blood pressure checked every year. You should have your blood pressure measured twice--once when you are at a hospital or clinic, and once when you are not at a hospital or clinic. Record the average of the two measurements. To check your blood pressure when you are not at a hospital or clinic, you can use:  An automated blood pressure machine at a pharmacy.  A home blood pressure monitor.  If you are between 41 years and 15 years old, ask your health care provider if you should take aspirin to prevent strokes.  Have regular diabetes screenings. This involves taking a blood sample to check your fasting blood sugar level.  If you  are at a normal weight and have a low risk for diabetes, have this test once every three years after 74 years of age.  If you are overweight and have a high risk for diabetes, consider being tested at a younger age or more often. PREVENTING INFECTION  Hepatitis B  If you have a higher risk for hepatitis B, you should be screened for this virus. You are considered at high risk for hepatitis B if:  You were born in a country where hepatitis B is common. Ask your health care provider which countries are considered high risk.  Your parents were born in a high-risk country, and you have not been immunized against hepatitis B (hepatitis B vaccine).  You have HIV or AIDS.  You use needles to inject street drugs.  You live with someone who has hepatitis B.  You have had sex with someone who has hepatitis B.  You get hemodialysis treatment.  You take certain medicines for conditions, including cancer, organ transplantation, and autoimmune conditions. Hepatitis C  Blood testing is recommended for:  Everyone born from [birth year] through 1964.  Anyone with known risk factors for hepatitis C. Sexually transmitted infections (STIs)  You should be screened for sexually transmitted infections (STIs) including gonorrhea and chlamydia if:  You are sexually active and are younger than 74 years of age.  You are older than 74 years of age and your health care provider tells you that you are at risk for this type of infection.  Your sexual activity has changed since you were last screened and you are at an increased risk for chlamydia or gonorrhea. Ask your health care provider if you are at risk.  If you do not have HIV, but are at risk, it may be recommended that you take a prescription medicine daily to prevent HIV infection. This is called pre-exposure prophylaxis (PrEP). You are considered at risk if:  You are sexually active and do not regularly use condoms or know the HIV status of your  partner(s).  You take drugs by injection.  You are sexually active with a partner who has HIV. Talk with your health care provider about whether you are at high risk of being infected with HIV. If you choose to begin PrEP, you should first be tested for HIV. You should then be tested every 3 months for as long as you are taking PrEP.  PREGNANCY   If you are premenopausal and  you may become pregnant, ask your health care provider about preconception counseling.  If you may become pregnant, take 400 to 800 micrograms (mcg) of folic acid every day.  If you want to prevent pregnancy, talk to your health care provider about birth control (contraception). OSTEOPOROSIS AND MENOPAUSE   Osteoporosis is a disease in which the bones lose minerals and strength with aging. This can result in serious bone fractures. Your risk for osteoporosis can be identified using a bone density scan.  If you are 53 years of age or older, or if you are at risk for osteoporosis and fractures, ask your health care provider if you should be screened.  Ask your health care provider whether you should take a calcium or vitamin D supplement to lower your risk for osteoporosis.  Menopause may have certain physical symptoms and risks.  Hormone replacement therapy may reduce some of these symptoms and risks. Talk to your health care provider about whether hormone replacement therapy is right for you.  HOME CARE INSTRUCTIONS   Schedule regular health, dental, and eye exams.  Stay current with your immunizations.   Do not use any tobacco products including cigarettes, chewing tobacco, or electronic cigarettes.  If you are pregnant, do not drink alcohol.  If you are breastfeeding, limit how much and how often you drink alcohol.  Limit alcohol intake to no more than 1 drink per day for nonpregnant women. One drink equals 12 ounces of beer, 5 ounces of wine, or 1 ounces of hard liquor.  Do not use street drugs.  Do  not share needles.  Ask your health care provider for help if you need support or information about quitting drugs.  Tell your health care provider if you often feel depressed.  Tell your health care provider if you have ever been abused or do not feel safe at home.   This information is not intended to replace advice given to you by your health care provider. Make sure you discuss any questions you have with your health care provider.   Document Released: 03/06/2011 Document Revised: 09/11/2014 Document Reviewed: 07/23/2013 Elsevier Interactive Patient Education [employer].

## 2016-03-21 DIAGNOSIS — M5416 Radiculopathy, lumbar region: Secondary | ICD-10-CM | POA: Diagnosis not present

## 2016-03-30 ENCOUNTER — Other Ambulatory Visit: Payer: Self-pay | Admitting: Internal Medicine

## 2016-05-30 ENCOUNTER — Encounter: Payer: Self-pay | Admitting: Nurse Practitioner

## 2016-05-30 ENCOUNTER — Ambulatory Visit (INDEPENDENT_AMBULATORY_CARE_PROVIDER_SITE_OTHER): Payer: PPO | Admitting: Nurse Practitioner

## 2016-05-30 ENCOUNTER — Ambulatory Visit (INDEPENDENT_AMBULATORY_CARE_PROVIDER_SITE_OTHER)
Admission: RE | Admit: 2016-05-30 | Discharge: 2016-05-30 | Disposition: A | Discharge status: Home / Self Care | Payer: PPO | Source: Ambulatory Visit | Attending: Nurse Practitioner | Admitting: Nurse Practitioner

## 2016-05-30 VITALS — BP 142/80 | HR 110 | Temp 99.4°F | Ht 60.0 in | Wt 148.0 lb

## 2016-05-30 DIAGNOSIS — J0140 Acute pansinusitis, unspecified: Secondary | ICD-10-CM

## 2016-05-30 DIAGNOSIS — R509 Fever, unspecified: Secondary | ICD-10-CM | POA: Diagnosis not present

## 2016-05-30 DIAGNOSIS — R059 Cough, unspecified: Secondary | ICD-10-CM

## 2016-05-30 DIAGNOSIS — R05 Cough: Secondary | ICD-10-CM

## 2016-05-30 MED ORDER — IPRATROPIUM BROMIDE 0.03 % NA SOLN: 2.0000 | Freq: Two times a day (BID) | NASAL | 0 refills | Status: DC

## 2016-05-30 MED ORDER — AZITHROMYCIN 250 MG PO TABS: 250.0000 mg | ORAL_TABLET | Freq: Every day | ORAL | 0 refills | Status: DC

## 2016-05-30 MED ORDER — METHYLPREDNISOLONE ACETATE 40 MG/ML IJ SUSP
40.0000 mg | Freq: Once | INTRAMUSCULAR | Status: AC
  Administered 2016-05-30: 40 mg via INTRAMUSCULAR

## 2016-05-30 MED ORDER — GUAIFENESIN ER 600 MG PO TB12: 600.0000 mg | ORAL_TABLET | Freq: Two times a day (BID) | ORAL | 0 refills | Status: DC | PRN

## 2016-05-30 MED ORDER — PROMETHAZINE-DM 6.25-15 MG/5ML PO SYRP: 5.0000 mL | ORAL_SOLUTION | Freq: Three times a day (TID) | ORAL | 0 refills | Status: DC | PRN

## 2016-05-30 MED ORDER — METHYLPREDNISOLONE ACETATE 40 MG/ML IJ SUSP: 40.0000 mg | Freq: Once | INTRAMUSCULAR | Status: DC

## 2016-05-30 NOTE — Progress Notes (Signed)
Reviewed with patient in office;

## 2016-05-30 NOTE — Progress Notes (Signed)
Subjective:  Patient ID: Mary [name], female    DOB: 09-19-1940  Age: 75 y.o. MRN: [id number]  CC: Fever (pt stated feels weak, coughing, running fever for about 1 week)   Fever   This is a new problem. The current episode started in the past 7 days. The problem occurs intermittently. The problem has been waxing and waning. The maximum temperature noted was 101 to 101.9 F. The temperature was taken using an axillary reading. Associated symptoms include chest pain, congestion, coughing, headaches and muscle aches. Pertinent negatives include no abdominal pain, diarrhea, ear pain, nausea, rash, sleepiness, sore throat, urinary pain, vomiting or wheezing. She has tried NSAIDs for the symptoms. The treatment provided mild relief.  Risk factors: no immunosuppression, no recent sickness, no recent travel and no sick contacts   non productive cough, nasal and sinus congestion with clear drainage. Chest wall pain with coughing. No SOB, no PND, no edema  Outpatient Medications Prior to Visit  Medication Sig Dispense Refill  . acyclovir (ZOVIRAX) 400 MG tablet TAKE 1 TABLET THREE TIMES A DAY FOR 5 DAYS AS DIRECTED FOR FEVER BLISTERS 30 tablet 1  . aspirin 81 MG tablet Take 81 mg by mouth daily.      . Calcium Carbonate-Vit D-Min (CALTRATE 600+D PLUS) 600-400 MG-UNIT per tablet Take 1 tablet by mouth daily. Reported on 03/14/2016    . cholecalciferol (VITAMIN D) 1000 UNITS tablet Take 1,000 Units by mouth daily. Reported on 03/14/2016    . losartan-hydrochlorothiazide (HYZAAR) 100-25 MG tablet TAKE 1 TABLET BY MOUTH DAILY. 90 tablet 3  . metoprolol succinate (TOPROL-XL) 50 MG 24 hr tablet TAKE 1 TABLET BY MOUTH DAILY WITH OR IMMEDIATELY FOLLOWING A MEAL 90 tablet 3  . Multiple Vitamins-Minerals (WOMENS MULTIVITAMIN PLUS) TABS Take 1 tablet by mouth daily.      . simvastatin (ZOCOR) 20 MG tablet TAKE 1 TABLET (20 MG TOTAL) BY MOUTH DAILY AT 6 PM. 90 tablet 1  . vitamin C (ASCORBIC ACID) 500 MG tablet  Take 500 mg by mouth daily. Reported on 03/14/2016     No facility-administered medications prior to visit.     ROS See HPI  Objective:  BP (!) 142/80 (BP Location: Left Arm, Patient Position: Sitting, Cuff Size: Large)   Pulse (!) 110   Temp 99.4 F (37.4 C)   Ht 5' (1.524 m)   Wt 148 lb (67.1 kg)   SpO2 98%   BMI 28.90 kg/m   BP Readings from Last 3 Encounters:  05/30/16 (!) 142/80  03/14/16 (!) 160/90  09/15/15 136/76    Wt Readings from Last 3 Encounters:  05/30/16 148 lb (67.1 kg)  03/14/16 145 lb (65.8 kg)  09/15/15 142 lb 6.4 oz (64.6 kg)    Physical Exam  Constitutional: She is oriented to person, place, and time. No distress.  HENT:  Right Ear: External ear normal.  Left Ear: External ear normal.  Nose: Nose normal.  Mouth/Throat: No oropharyngeal exudate.  Eyes: No scleral icterus.  Neck: Normal range of motion. Neck supple.  Cardiovascular: Normal rate, regular rhythm and normal heart sounds.   Pulmonary/Chest: Effort normal. No respiratory distress. She has rales.  Decreased lung sounds in bilateral lung bases  Musculoskeletal: Normal range of motion. She exhibits no edema.  Lymphadenopathy:    She has no cervical adenopathy.  Neurological: She is alert and oriented to person, place, and time.  Skin: Skin is warm and dry.  Psychiatric: She has a normal mood and affect.  Her behavior is normal.    Lab Results  Component Value Date   WBC 11.7 (H) 03/14/2016   HGB 14.7 03/14/2016   HCT 43.0 03/14/2016   PLT 285.0 03/14/2016   GLUCOSE 114 (H) 03/14/2016   CHOL 210 (H) 03/14/2016   TRIG 243.0 (H) 03/14/2016   HDL 54.90 03/14/2016   LDLDIRECT 130.0 03/14/2016   LDLCALC 97 01/13/2015   ALT 22 03/14/2016   AST 23 03/14/2016   NA 138 03/14/2016   K 3.8 03/14/2016   CL 101 03/14/2016   CREATININE 0.94 03/14/2016   BUN 19 03/14/2016   CO2 28 03/14/2016   TSH 0.56 06/04/2014   HGBA1C 6.3 03/14/2016   No acute finding on CXR.  Assessment &  Plan:   Mary [name] was seen today for fever.  Diagnoses and all orders for this visit:  Acute pansinusitis, recurrence not specified -     promethazine-dextromethorphan (PROMETHAZINE-DM) 6.25-15 MG/5ML syrup; Take 5 mLs by mouth 3 (three) times daily as needed for cough. -     guaiFENesin (MUCINEX) 600 MG 12 hr tablet; Take 1 tablet (600 mg total) by mouth 2 (two) times daily as needed for cough or to loosen phlegm. -     azithromycin (ZITHROMAX Z-PAK) 250 MG tablet; Take 1 tablet (250 mg total) by mouth daily. Take 2tabs on first day, then 1tab once a day till complete -     Discontinue: methylPREDNISolone acetate (DEPO-MEDROL) injection 40 mg; Inject 1 mL (40 mg total) into the muscle once. -     ipratropium (ATROVENT) 0.03 % nasal spray; Place 2 sprays into both nostrils 2 (two) times daily. Do not use for more than 5days. -     methylPREDNISolone acetate (DEPO-MEDROL) injection 40 mg; Inject 1 mL (40 mg total) into the muscle once.  Cough -     DG Chest 2 View; Future -     promethazine-dextromethorphan (PROMETHAZINE-DM) 6.25-15 MG/5ML syrup; Take 5 mLs by mouth 3 (three) times daily as needed for cough. -     guaiFENesin (MUCINEX) 600 MG 12 hr tablet; Take 1 tablet (600 mg total) by mouth 2 (two) times daily as needed for cough or to loosen phlegm. -     azithromycin (ZITHROMAX Z-PAK) 250 MG tablet; Take 1 tablet (250 mg total) by mouth daily. Take 2tabs on first day, then 1tab once a day till complete -     Discontinue: methylPREDNISolone acetate (DEPO-MEDROL) injection 40 mg; Inject 1 mL (40 mg total) into the muscle once. -     ipratropium (ATROVENT) 0.03 % nasal spray; Place 2 sprays into both nostrils 2 (two) times daily. Do not use for more than 5days.  Fever and chills -     DG Chest 2 View; Future -     promethazine-dextromethorphan (PROMETHAZINE-DM) 6.25-15 MG/5ML syrup; Take 5 mLs by mouth 3 (three) times daily as needed for cough. -     guaiFENesin (MUCINEX) 600 MG 12 hr tablet;  Take 1 tablet (600 mg total) by mouth 2 (two) times daily as needed for cough or to loosen phlegm. -     azithromycin (ZITHROMAX Z-PAK) 250 MG tablet; Take 1 tablet (250 mg total) by mouth daily. Take 2tabs on first day, then 1tab once a day till complete -     Discontinue: methylPREDNISolone acetate (DEPO-MEDROL) injection 40 mg; Inject 1 mL (40 mg total) into the muscle once. -     ipratropium (ATROVENT) 0.03 % nasal spray; Place 2 sprays into both nostrils 2 (  two) times daily. Do not use for more than 5days.   I am having Mary [name] start on promethazine-dextromethorphan, guaiFENesin, azithromycin, and ipratropium. I am also having her maintain her aspirin, CALTRATE 600+D PLUS, WOMENS MULTIVITAMIN PLUS, vitamin C, cholecalciferol, metoprolol succinate, losartan-hydrochlorothiazide, acyclovir, and simvastatin. We will continue to administer methylPREDNISolone acetate.  Meds ordered this encounter  Medications  . promethazine-dextromethorphan (PROMETHAZINE-DM) 6.25-15 MG/5ML syrup    Sig: Take 5 mLs by mouth 3 (three) times daily as needed for cough.    Dispense:  240 mL    Refill:  0    Order Specific Question:   Supervising Provider    Answer:   Cassandria Anger [[id number]]  . guaiFENesin (MUCINEX) 600 MG 12 hr tablet    Sig: Take 1 tablet (600 mg total) by mouth 2 (two) times daily as needed for cough or to loosen phlegm.    Dispense:  14 tablet    Refill:  0    Order Specific Question:   Supervising Provider    Answer:   Cassandria Anger [[id number]]  . azithromycin (ZITHROMAX Z-PAK) 250 MG tablet    Sig: Take 1 tablet (250 mg total) by mouth daily. Take 2tabs on first day, then 1tab once a day till complete    Dispense:  6 tablet    Refill:  0    Order Specific Question:   Supervising Provider    Answer:   Cassandria Anger [[id number]]  . DISCONTD: methylPREDNISolone acetate (DEPO-MEDROL) injection 40 mg  . ipratropium (ATROVENT) 0.03 % nasal spray    Sig: Place 2 sprays into both  nostrils 2 (two) times daily. Do not use for more than 5days.    Dispense:  30 mL    Refill:  0    Order Specific Question:   Supervising Provider    Answer:   Cassandria Anger [[id number]]  . methylPREDNISolone acetate (DEPO-MEDROL) injection 40 mg   Follow-up: Return if symptoms worsen or fail to improve.  Wilfred Lacy, NP

## 2016-05-30 NOTE — Patient Instructions (Signed)
[facility] Instructions: Use over-the-counter  "cold" medicines  such as "Tylenol cold" , "Advil cold",  "Mucinex" or" Mucinex D"  for cough and congestion.  Avoid decongestants if you have high blood pressure. Use" Delsym" or" Robitussin" cough syrup varietis for cough.  You can use plain "Tylenol" or "Advi"l for fever, chills and achyness.   "Common cold" symptoms are usually triggered by a virus.  The antibiotics are usually not necessary. On average, a" viral cold" illness would take 4-7 days to resolve. Please, make an appointment if you are not better or if you're worse.

## 2016-06-29 ENCOUNTER — Encounter: Payer: Self-pay | Admitting: Geriatric Medicine

## 2016-07-18 DIAGNOSIS — H25099 Other age-related incipient cataract, unspecified eye: Secondary | ICD-10-CM | POA: Diagnosis not present

## 2016-09-14 ENCOUNTER — Other Ambulatory Visit (INDEPENDENT_AMBULATORY_CARE_PROVIDER_SITE_OTHER): Payer: PPO

## 2016-09-14 ENCOUNTER — Ambulatory Visit (INDEPENDENT_AMBULATORY_CARE_PROVIDER_SITE_OTHER): Payer: PPO | Admitting: Internal Medicine

## 2016-09-14 ENCOUNTER — Encounter: Payer: Self-pay | Admitting: Internal Medicine

## 2016-09-14 VITALS — BP 150/80 | HR 84 | Temp 98.7°F | Resp 14 | Ht 60.0 in | Wt 151.0 lb

## 2016-09-14 DIAGNOSIS — I10 Essential (primary) hypertension: Secondary | ICD-10-CM

## 2016-09-14 DIAGNOSIS — R7301 Impaired fasting glucose: Secondary | ICD-10-CM

## 2016-09-14 LAB — HEMOGLOBIN A1C: HEMOGLOBIN A1C: 6.7 % — AB (ref 4.6–6.5)

## 2016-09-14 NOTE — Patient Instructions (Signed)
We will check the sugars today and do not need to make any other changes.   Keep up the good work with the health.

## 2016-09-14 NOTE — Progress Notes (Signed)
   Subjective:    Patient ID: Mary [name], female    DOB: 1941/01/20, 77 y.o.   MRN: [id number]  HPI The patient is a 76 YO female coming in for follow up of her blood pressure (doing better today, she has some white coat hypertension in the office for years but checks normal at home, no headaches or chest pains or SOB) and her sugars (still watching her diet some and exercising, no low or high sugar symptoms). No new concerns.   Review of Systems  Constitutional: Negative for activity change, appetite change, fatigue, fever and unexpected weight change.  Respiratory: Negative.   Cardiovascular: Negative.   Gastrointestinal: Negative.   Musculoskeletal: Negative.   Skin: Negative.       Objective:   Physical Exam  Constitutional: She is oriented to person, place, and time. She appears well-developed and well-nourished.  HENT:  Head: Normocephalic and atraumatic.  Eyes: EOM are normal.  Cardiovascular: Normal rate and regular rhythm.   Pulmonary/Chest: Effort normal and breath sounds normal. No respiratory distress. She has no wheezes. She has no rales.  Abdominal: Soft. She exhibits no distension. There is no tenderness. There is no rebound.  Neurological: She is alert and oriented to person, place, and time.  Skin: Skin is warm and dry.   Vitals:   09/14/16 0921  BP: (!) 150/80  Pulse: 84  Resp: 14  Temp: 98.7 F (37.1 C)  TempSrc: Oral  SpO2: 97%  Weight: 151 lb (68.5 kg)  Height: 5' (1.524 m)      Assessment & Plan:

## 2016-09-14 NOTE — Assessment & Plan Note (Signed)
BP better today, she has normal readings at home. Taking losartan/hctz and metoprolol. Last CMP normal.

## 2016-09-14 NOTE — Assessment & Plan Note (Signed)
Checking HgA1c, prior in pre-diabetic range. Had 1 elevated reading which was never repeated so no diabetes. She is working with diet and exercise. Adjust as needed.

## 2016-09-14 NOTE — Progress Notes (Signed)
Pre visit review using our clinic review tool, if applicable. No additional management support is needed unless otherwise documented below in the visit note. 

## 2016-09-27 ENCOUNTER — Other Ambulatory Visit: Payer: Self-pay | Admitting: Internal Medicine

## 2016-12-23 ENCOUNTER — Other Ambulatory Visit: Payer: Self-pay | Admitting: Internal Medicine

## 2017-03-15 ENCOUNTER — Encounter: Payer: PPO | Admitting: Internal Medicine

## 2017-03-22 ENCOUNTER — Other Ambulatory Visit: Payer: Self-pay | Admitting: Internal Medicine

## 2017-05-16 ENCOUNTER — Telehealth: Payer: Self-pay | Admitting: Internal Medicine

## 2017-05-16 NOTE — Telephone Encounter (Signed)
Called pt to schedule AWV. Lvm for pt to call office to schedule appt. Last AWV 03/14/2016, pt can schedule AWV at anytime.

## 2017-05-28 ENCOUNTER — Other Ambulatory Visit: Payer: Self-pay | Admitting: Internal Medicine

## 2017-06-07 ENCOUNTER — Encounter: Payer: PPO | Admitting: Internal Medicine

## 2017-06-19 ENCOUNTER — Other Ambulatory Visit (INDEPENDENT_AMBULATORY_CARE_PROVIDER_SITE_OTHER): Payer: PPO

## 2017-06-19 ENCOUNTER — Encounter: Payer: Self-pay | Admitting: Internal Medicine

## 2017-06-19 ENCOUNTER — Ambulatory Visit (INDEPENDENT_AMBULATORY_CARE_PROVIDER_SITE_OTHER): Payer: PPO | Admitting: Internal Medicine

## 2017-06-19 VITALS — BP 140/80 | HR 87 | Temp 98.1°F | Ht 60.0 in | Wt 150.0 lb

## 2017-06-19 DIAGNOSIS — Z0001 Encounter for general adult medical examination with abnormal findings: Secondary | ICD-10-CM | POA: Diagnosis not present

## 2017-06-19 DIAGNOSIS — R7301 Impaired fasting glucose: Secondary | ICD-10-CM | POA: Diagnosis not present

## 2017-06-19 DIAGNOSIS — G3184 Mild cognitive impairment, so stated: Secondary | ICD-10-CM

## 2017-06-19 DIAGNOSIS — F0280 Dementia in other diseases classified elsewhere without behavioral disturbance: Secondary | ICD-10-CM | POA: Insufficient documentation

## 2017-06-19 DIAGNOSIS — Z23 Encounter for immunization: Secondary | ICD-10-CM | POA: Diagnosis not present

## 2017-06-19 DIAGNOSIS — E7800 Pure hypercholesterolemia, unspecified: Secondary | ICD-10-CM

## 2017-06-19 DIAGNOSIS — E785 Hyperlipidemia, unspecified: Secondary | ICD-10-CM

## 2017-06-19 DIAGNOSIS — I10 Essential (primary) hypertension: Secondary | ICD-10-CM

## 2017-06-19 DIAGNOSIS — Z0000 Encounter for general adult medical examination without abnormal findings: Secondary | ICD-10-CM

## 2017-06-19 LAB — COMPREHENSIVE METABOLIC PANEL
ALBUMIN: 4 g/dL (ref 3.5–5.2)
ALT: 24 U/L (ref 0–35)
AST: 24 U/L (ref 0–37)
Alkaline Phosphatase: 77 U/L (ref 39–117)
BUN: 11 mg/dL (ref 6–23)
CALCIUM: 9.9 mg/dL (ref 8.4–10.5)
CHLORIDE: 102 meq/L (ref 96–112)
CO2: 26 meq/L (ref 19–32)
CREATININE: 1.12 mg/dL (ref 0.40–1.20)
GFR: 50.4 mL/min — AB (ref >60.00)
Glucose, Bld: 152 mg/dL — ABNORMAL HIGH (ref 70–99)
POTASSIUM: 3.7 meq/L (ref 3.5–5.1)
Sodium: 140 mEq/L (ref 135–145)
Total Bilirubin: 0.5 mg/dL (ref 0.2–1.2)
Total Protein: 7 g/dL (ref 6.0–8.3)

## 2017-06-19 LAB — LIPID PANEL
CHOL/HDL RATIO: 3
CHOLESTEROL: 174 mg/dL (ref 0–200)
HDL: 56.4 mg/dL (ref >39.00)
LDL CALC: 90 mg/dL (ref 0–99)
NonHDL: 117.62
TRIGLYCERIDES: 136 mg/dL (ref 0.0–149.0)
VLDL: 27.2 mg/dL (ref 0.0–40.0)

## 2017-06-19 LAB — HEMOGLOBIN A1C: Hgb A1c MFr Bld: 7.2 % — ABNORMAL HIGH (ref 4.6–6.5)

## 2017-06-19 MED ORDER — ZOSTER VAC RECOMB ADJUVANTED 50 MCG/0.5ML IM SUSR: 0.5000 mL | Freq: Once | INTRAMUSCULAR | 1 refills | Status: AC

## 2017-06-19 MED ORDER — DONEPEZIL HCL 10 MG PO TABS: 10.0000 mg | ORAL_TABLET | Freq: Every day | ORAL | 1 refills | Status: DC

## 2017-06-19 NOTE — Assessment & Plan Note (Signed)
Significant family history of alzheimer's in 74s. She is having more problems with short term memory and family is concerned about repeating. She is willing to start aricept to stabilize memory.

## 2017-06-19 NOTE — Progress Notes (Signed)
   Subjective:    Patient ID: Mary [name], female    DOB: 1941/08/16, 76 y.o.   MRN: [id number]  HPI Here for medicare wellness and physical, no new complaints. Please see A/P for status and treatment of chronic medical problems.   Diet: heart healthy Physical activity: sedentary Depression/mood screen: negative Hearing: intact to whispered voice Visual acuity: grossly normal, performs annual eye exam  ADLs: capable Fall risk: none Home safety: good Cognitive evaluation: intact to orientation, naming, recall and repetition, mild to moderate memory changes, repeating a lot and needing calendar to remember events, handles finances without problems EOL planning: adv directives discussed, in place  I have personally reviewed and have noted 1. The patient's medical and social history - reviewed today no changes 2. Their use of alcohol, tobacco or illicit drugs 3. Their current medications and supplements 4. The patient's functional ability including ADL's, fall risks, home safety risks and hearing or visual impairment. 5. Diet and physical activities 6. Evidence for depression or mood disorders 7. Care team reviewed and updated (available in snapshot)  Review of Systems  Constitutional: Negative.   HENT: Negative.   Eyes: Negative.   Respiratory: Negative for cough, chest tightness and shortness of breath.   Cardiovascular: Negative for chest pain, palpitations and leg swelling.  Gastrointestinal: Negative for abdominal distention, abdominal pain, constipation, diarrhea, nausea and vomiting.  Musculoskeletal: Negative.   Skin: Negative.   Neurological: Negative.        Memory changes  Psychiatric/Behavioral: Negative.       Objective:   Physical Exam  Constitutional: She is oriented to person, place, and time. She appears well-developed and well-nourished.  HENT:  Head: Normocephalic and atraumatic.  Eyes: EOM are normal.  Neck: Normal range of motion.  Cardiovascular:  Normal rate and regular rhythm.   Pulmonary/Chest: Effort normal and breath sounds normal. No respiratory distress. She has no wheezes. She has no rales.  Abdominal: Soft. Bowel sounds are normal. She exhibits no distension. There is no tenderness. There is no rebound.  Musculoskeletal: She exhibits no edema.  Neurological: She is alert and oriented to person, place, and time. Coordination normal.  Recall 2/3 at 5 minutes, cannot spell world backwards, serial 7s okay  Skin: Skin is warm and dry.  Psychiatric: She has a normal mood and affect.   Vitals:   06/19/17 0851  BP: 140/80  Pulse: 87  Temp: 98.1 F (36.7 C)  TempSrc: Oral  SpO2: 99%  Weight: 150 lb (68 kg)  Height: 5' (1.524 m)      Assessment & Plan:  Flu shot given at visit.

## 2017-06-19 NOTE — Patient Instructions (Signed)
We have given you a prescription for the shingles vaccine to take to the pharmacy to see if the insurance covers it.   We have sent in the new memory medicine called aricept (donepizil) which helps keep the memory stable over time. It can cause diarrhea in some people so if you notice that call us.   Memory Compensation Strategies  1. Use "WARM" strategy.  W= write it down  A= associate it  R= repeat it  M= make a mental note  2.   You can keep a Social worker.  Use a 3-ring notebook with sections for the following: calendar, important names and phone numbers,  medications, doctors' names/phone numbers, lists/reminders, and a section to journal what you did  each day.   3.    Use a calendar to write appointments down.  4.    Write yourself a schedule for the day.  This can be placed on the calendar or in a separate section of the Memory Notebook.  Keeping a  regular schedule can help memory.  5.    Use medication organizer with sections for each day or morning/evening pills.  You may need help loading it  6.    Keep a basket, or pegboard by the door.  Place items that you need to take out with you in the basket or on the pegboard.  You may also want to  include a message board for reminders.  7.    Use sticky notes.  Place sticky notes with reminders in a place where the task is performed.  For example: " turn off the  stove" placed by the stove, "lock the door" placed on the door at eye level, " take your medications" on  the bathroom mirror or by the place where you normally take your medications.  8.    Use alarms/timers.  Use while cooking to remind yourself to check on food or as a reminder to take your medicine, or as a  reminder to make a call, or as a reminder to perform another task, etc.  Health Maintenance, Female Adopting a healthy lifestyle and getting preventive care can go a long way to promote health and wellness. Talk with your health care provider about what  schedule of regular examinations is right for you. This is a good chance for you to check in with your provider about disease prevention and staying healthy. In between checkups, there are plenty of things you can do on your own. Experts have done a lot of research about which lifestyle changes and preventive measures are most likely to keep you healthy. Ask your health care provider for more information. Weight and diet Eat a healthy diet  Be sure to include plenty of vegetables, fruits, low-fat dairy products, and lean protein.  Do not eat a lot of foods high in solid fats, added sugars, or salt.  Get regular exercise. This is one of the most important things you can do for your health. ? Most adults should exercise for at least 150 minutes each week. The exercise should increase your heart rate and make you sweat (moderate-intensity exercise). ? Most adults should also do strengthening exercises at least twice a week. This is in addition to the moderate-intensity exercise.  Maintain a healthy weight  Body mass index (BMI) is a measurement that can be used to identify possible weight problems. It estimates body fat based on height and weight. Your health care provider can help determine your BMI and help  you achieve or maintain a healthy weight.  For females 57 years of age and older: ? A BMI below 18.5 is considered underweight. ? A BMI of 18.5 to 24.9 is normal. ? A BMI of 25 to 29.9 is considered overweight. ? A BMI of 30 and above is considered obese.  Watch levels of cholesterol and blood lipids  You should start having your blood tested for lipids and cholesterol at 75 years of age, then have this test every 5 years.  You may need to have your cholesterol levels checked more often if: ? Your lipid or cholesterol levels are high. ? You are older than 75 years of age. ? You are at high risk for heart disease.  Cancer screening Lung Cancer  Lung cancer screening is recommended  for adults 29-31 years old who are at high risk for lung cancer because of a history of smoking.  A yearly low-dose CT scan of the lungs is recommended for people who: ? Currently smoke. ? Have quit within the past 15 years. ? Have at least a 30-pack-year history of smoking. A pack year is smoking an average of one pack of cigarettes a day for 1 year.  Yearly screening should continue until it has been 15 years since you quit.  Yearly screening should stop if you develop a health problem that would prevent you from having lung cancer treatment.  Breast Cancer  Practice breast self-awareness. This means understanding how your breasts normally appear and feel.  It also means doing regular breast self-exams. Let your health care provider know about any changes, no matter how small.  If you are in your 20s or 30s, you should have a clinical breast exam (CBE) by a health care provider every 1-3 years as part of a regular health exam.  If you are 24 or older, have a CBE every year. Also consider having a breast X-ray (mammogram) every year.  If you have a family history of breast cancer, talk to your health care provider about genetic screening.  If you are at high risk for breast cancer, talk to your health care provider about having an MRI and a mammogram every year.  Breast cancer gene (BRCA) assessment is recommended for women who have family members with BRCA-related cancers. BRCA-related cancers include: ? Breast. ? Ovarian. ? Tubal. ? Peritoneal cancers.  Results of the assessment will determine the need for genetic counseling and BRCA1 and BRCA2 testing.  Cervical Cancer Your health care provider may recommend that you be screened regularly for cancer of the pelvic organs (ovaries, uterus, and vagina). This screening involves a pelvic examination, including checking for microscopic changes to the surface of your cervix (Pap test). You may be encouraged to have this screening done  every 3 years, beginning at age 63.  For women ages 46-65, health care providers may recommend pelvic exams and Pap testing every 3 years, or they may recommend the Pap and pelvic exam, combined with testing for human papilloma virus (HPV), every 5 years. Some types of HPV increase your risk of cervical cancer. Testing for HPV may also be done on women of any age with unclear Pap test results.  Other health care providers may not recommend any screening for nonpregnant women who are considered low risk for pelvic cancer and who do not have symptoms. Ask your health care provider if a screening pelvic exam is right for you.  If you have had past treatment for cervical cancer or a condition  that could lead to cancer, you need Pap tests and screening for cancer for at least 20 years after your treatment. If Pap tests have been discontinued, your risk factors (such as having a new sexual partner) need to be reassessed to determine if screening should resume. Some women have medical problems that increase the chance of getting cervical cancer. In these cases, your health care provider may recommend more frequent screening and Pap tests.  Colorectal Cancer  This type of cancer can be detected and often prevented.  Routine colorectal cancer screening usually begins at 75 years of age and continues through 75 years of age.  Your health care provider may recommend screening at an earlier age if you have risk factors for colon cancer.  Your health care provider may also recommend using home test kits to check for hidden blood in the stool.  A small camera at the end of a tube can be used to examine your colon directly (sigmoidoscopy or colonoscopy). This is done to check for the earliest forms of colorectal cancer.  Routine screening usually begins at age 110.  Direct examination of the colon should be repeated every 5-10 years through 75 years of age. However, you may need to be screened more often if  early forms of precancerous polyps or small growths are found.  Skin Cancer  Check your skin from head to toe regularly.  Tell your health care provider about any new moles or changes in moles, especially if there is a change in a mole's shape or color.  Also tell your health care provider if you have a mole that is larger than the size of a pencil eraser.  Always use sunscreen. Apply sunscreen liberally and repeatedly throughout the day.  Protect yourself by wearing long sleeves, pants, a wide-brimmed hat, and sunglasses whenever you are outside.  Heart disease, diabetes, and high blood pressure  High blood pressure causes heart disease and increases the risk of stroke. High blood pressure is more likely to develop in: ? People who have blood pressure in the high end of the normal range (130-139/85-89 mm Hg). ? People who are overweight or obese. ? People who are African American.  If you are 36-70 years of age, have your blood pressure checked every 3-5 years. If you are 91 years of age or older, have your blood pressure checked every year. You should have your blood pressure measured twice-once when you are at a hospital or clinic, and once when you are not at a hospital or clinic. Record the average of the two measurements. To check your blood pressure when you are not at a hospital or clinic, you can use: ? An automated blood pressure machine at a pharmacy. ? A home blood pressure monitor.  If you are between 78 years and 18 years old, ask your health care provider if you should take aspirin to prevent strokes.  Have regular diabetes screenings. This involves taking a blood sample to check your fasting blood sugar level. ? If you are at a normal weight and have a low risk for diabetes, have this test once every three years after 75 years of age. ? If you are overweight and have a high risk for diabetes, consider being tested at a younger age or more often. Preventing  infection Hepatitis B  If you have a higher risk for hepatitis B, you should be screened for this virus. You are considered at high risk for hepatitis B if: ? You were  born in a country where hepatitis B is common. Ask your health care provider which countries are considered high risk. ? Your parents were born in a high-risk country, and you have not been immunized against hepatitis B (hepatitis B vaccine). ? You have HIV or AIDS. ? You use needles to inject street drugs. ? You live with someone who has hepatitis B. ? You have had sex with someone who has hepatitis B. ? You get hemodialysis treatment. ? You take certain medicines for conditions, including cancer, organ transplantation, and autoimmune conditions.  Hepatitis C  Blood testing is recommended for: ? Everyone born from [birth year] through 1964. ? Anyone with known risk factors for hepatitis C.  Sexually transmitted infections (STIs)  You should be screened for sexually transmitted infections (STIs) including gonorrhea and chlamydia if: ? You are sexually active and are younger than 75 years of age. ? You are older than 75 years of age and your health care provider tells you that you are at risk for this type of infection. ? Your sexual activity has changed since you were last screened and you are at an increased risk for chlamydia or gonorrhea. Ask your health care provider if you are at risk.  If you do not have HIV, but are at risk, it may be recommended that you take a prescription medicine daily to prevent HIV infection. This is called pre-exposure prophylaxis (PrEP). You are considered at risk if: ? You are sexually active and do not regularly use condoms or know the HIV status of your partner(s). ? You take drugs by injection. ? You are sexually active with a partner who has HIV.  Talk with your health care provider about whether you are at high risk of being infected with HIV. If you choose to begin PrEP, you should first be  tested for HIV. You should then be tested every 3 months for as long as you are taking PrEP. Pregnancy  If you are premenopausal and you may become pregnant, ask your health care provider about preconception counseling.  If you may become pregnant, take 400 to 800 micrograms (mcg) of folic acid every day.  If you want to prevent pregnancy, talk to your health care provider about birth control (contraception). Osteoporosis and menopause  Osteoporosis is a disease in which the bones lose minerals and strength with aging. This can result in serious bone fractures. Your risk for osteoporosis can be identified using a bone density scan.  If you are 76 years of age or older, or if you are at risk for osteoporosis and fractures, ask your health care provider if you should be screened.  Ask your health care provider whether you should take a calcium or vitamin D supplement to lower your risk for osteoporosis.  Menopause may have certain physical symptoms and risks.  Hormone replacement therapy may reduce some of these symptoms and risks. Talk to your health care provider about whether hormone replacement therapy is right for you. Follow these instructions at home:  Schedule regular health, dental, and eye exams.  Stay current with your immunizations.  Do not use any tobacco products including cigarettes, chewing tobacco, or electronic cigarettes.  If you are pregnant, do not drink alcohol.  If you are breastfeeding, limit how much and how often you drink alcohol.  Limit alcohol intake to no more than 1 drink per day for nonpregnant women. One drink equals 12 ounces of beer, 5 ounces of wine, or 1 ounces of hard liquor.  Do not use street drugs.  Do not share needles.  Ask your health care provider for help if you need support or information about quitting drugs.  Tell your health care provider if you often feel depressed.  Tell your health care provider if you have ever been abused  or do not feel safe at home. This information is not intended to replace advice given to you by your health care provider. Make sure you discuss any questions you have with your health care provider. Document Released: 03/06/2011 Document Revised: 01/27/2016 Document Reviewed: 05/25/2015 Elsevier Interactive Patient Education  [employer].

## 2017-06-20 NOTE — Assessment & Plan Note (Signed)
BP at goal on losartan/hctz and checking CMP and adjust as needed.  

## 2017-06-20 NOTE — Assessment & Plan Note (Signed)
Caught up on mammogram and colonoscopy up to date. Given flu shot at visit. Counseled about shingrix. Counseled about sun safety and memory management. Given 10 year screening recommendations.

## 2017-06-20 NOTE — Assessment & Plan Note (Signed)
Checking HgA1c, foot exam done.

## 2017-06-20 NOTE — Assessment & Plan Note (Signed)
Checking lipid panel and adjust simvastatin as needed.  

## 2017-07-10 ENCOUNTER — Other Ambulatory Visit: Payer: Self-pay

## 2017-07-10 MED ORDER — SIMVASTATIN 20 MG PO TABS: ORAL_TABLET | ORAL | 1 refills | Status: DC

## 2017-12-16 ENCOUNTER — Other Ambulatory Visit: Payer: Self-pay | Admitting: Internal Medicine

## 2017-12-18 ENCOUNTER — Ambulatory Visit (INDEPENDENT_AMBULATORY_CARE_PROVIDER_SITE_OTHER): Payer: PPO | Admitting: Internal Medicine

## 2017-12-18 ENCOUNTER — Encounter: Payer: Self-pay | Admitting: Internal Medicine

## 2017-12-18 VITALS — BP 132/82 | HR 68 | Temp 97.8°F | Ht 60.0 in | Wt 146.0 lb

## 2017-12-18 DIAGNOSIS — G3184 Mild cognitive impairment, so stated: Secondary | ICD-10-CM

## 2017-12-18 DIAGNOSIS — E119 Type 2 diabetes mellitus without complications: Secondary | ICD-10-CM | POA: Diagnosis not present

## 2017-12-18 DIAGNOSIS — I10 Essential (primary) hypertension: Secondary | ICD-10-CM

## 2017-12-18 DIAGNOSIS — R7301 Impaired fasting glucose: Secondary | ICD-10-CM

## 2017-12-18 NOTE — Assessment & Plan Note (Signed)
Taking metoprolol, losartan/hctz. Last BMP at goal so no indication for change today. Continue to monitor BP with new diabetes goal would be around 130/80.

## 2017-12-18 NOTE — Assessment & Plan Note (Signed)
Several prior HgA1c above 6.5. Checking HgA1c today and foot exam done. At goal with diet control. Taking ARB and statin.

## 2017-12-18 NOTE — Assessment & Plan Note (Signed)
Stable on aricept, she did not elect to do MMSE today. Will monitor at next visit. No side effects.

## 2017-12-18 NOTE — Patient Instructions (Signed)
We will check the sugars today and call you back about the results.    

## 2017-12-18 NOTE — Progress Notes (Signed)
   Subjective:    Patient ID: Mary [name], female    DOB: 08-06-1941, 77 y.o.   MRN: [id number]  HPI The patient is a 77 YO female coming in for follow up of memory (started aricept last visit, denies problems with it, has not noticed much difference, feels memory slightly improved), and her diabetes (diet controlled, not complicated, denies change in diet, exercising more as the weather improves and doing more outdoor work) and blood pressure (losartan/hctz and metoprolol and BP at goal, denies side effects, denies chest pains or headaches).   Review of Systems  Constitutional: Negative.   HENT: Negative.   Eyes: Negative.   Respiratory: Negative for cough, chest tightness and shortness of breath.   Cardiovascular: Negative for chest pain, palpitations and leg swelling.  Gastrointestinal: Negative for abdominal distention, abdominal pain, constipation, diarrhea, nausea and vomiting.  Musculoskeletal: Negative.   Skin: Negative.   Neurological: Negative.   Psychiatric/Behavioral: Negative.       Objective:   Physical Exam  Constitutional: She is oriented to person, place, and time. She appears well-developed and well-nourished.  overweight  HENT:  Head: Normocephalic and atraumatic.  Eyes: EOM are normal.  Neck: Normal range of motion.  Cardiovascular: Normal rate and regular rhythm.  Pulmonary/Chest: Effort normal and breath sounds normal. No respiratory distress. She has no wheezes. She has no rales.  Abdominal: Soft. Bowel sounds are normal. She exhibits no distension. There is no tenderness. There is no rebound.  Musculoskeletal: She exhibits no edema.  Neurological: She is alert and oriented to person, place, and time. Coordination normal.  Skin: Skin is warm and dry.  Psychiatric: She has a normal mood and affect.   Vitals:   12/18/17 0754  BP: 132/82  Pulse: 68  Temp: 97.8 F (36.6 C)  TempSrc: Oral  SpO2: 97%  Weight: 146 lb (66.2 kg)  Height: 5' (1.524 m)      Assessment & Plan:

## 2018-01-02 ENCOUNTER — Other Ambulatory Visit: Payer: Self-pay | Admitting: Internal Medicine

## 2018-06-12 ENCOUNTER — Other Ambulatory Visit: Payer: Self-pay | Admitting: Internal Medicine

## 2018-06-20 ENCOUNTER — Ambulatory Visit: Payer: PPO | Admitting: Internal Medicine

## 2018-06-20 DIAGNOSIS — Z0289 Encounter for other administrative examinations: Secondary | ICD-10-CM

## 2018-06-25 ENCOUNTER — Ambulatory Visit: Payer: PPO | Admitting: Internal Medicine

## 2018-06-25 DIAGNOSIS — Z0289 Encounter for other administrative examinations: Secondary | ICD-10-CM

## 2018-06-26 NOTE — Progress Notes (Signed)
Subjective:   Mary [name] is a 77 y.o. female who presents for Medicare Annual (Subsequent) preventive examination.  Review of Systems:  No ROS.  Medicare Wellness Visit. Additional risk factors are reflected in the social history.  Cardiac Risk Factors include: advanced age (>77men, >79 women);diabetes mellitus;hypertension Sleep patterns: feels rested on waking, gets up 1-2 times nightly to void and sleeps 8-9 hours nightly.    Home Safety/Smoke Alarms: Feels safe in home. Smoke alarms in place.  Living environment; residence and Firearm Safety: 1-story house/ trailer, no firearms. Lives with wife, no needs for DME, good support system Seat Belt Safety/Bike Helmet: Wears seat belt.     Objective:     Vitals: BP 121/72   Pulse 69   Resp 18   Ht 5' (1.524 m)   Wt 139 lb (63 kg)   SpO2 99%   BMI 27.15 kg/m   Body mass index is 27.15 kg/m.  Advanced Directives 06/27/2018 10/30/2012 10/25/2012 07/13/2012  Does Patient Have a Medical Advance Directive? Yes Patient has advance directive, copy not in chart Patient has advance directive, copy not in chart Patient has advance directive, copy not in chart  Type of Advance Directive [place];Living will [place];Living will [place];Living will Living will  Copy of [place] in Chart? No - copy requested (No Data) - Copy requested from family  Pre-existing out of facility DNR order (yellow form or pink MOST form) - No - No    Tobacco Social History   Tobacco Use  Smoking Status Never Smoker  Smokeless Tobacco Never Used     Counseling given: Not Answered  Past Medical History:  Diagnosis Date  . Anxiety   . Carpal tunnel syndrome    bilateral  . DDD (degenerative disc disease)   . Diverticulosis of colon   . DJD (degenerative joint disease)   . Eyelid cyst    removed from R eyelid 11/20/12 - opth   . GERD (gastroesophageal reflux disease)   .  History of cyst of breast   . Hypercholesteremia   . Hypertension   . Kidney stones   . Osteoporosis   . Venous insufficiency    Past Surgical History:  Procedure Laterality Date  . CARPAL TUNNEL RELEASE Right 2009  . CARPAL TUNNEL RELEASE Left 10/30/2012   Procedure: CARPAL TUNNEL RELEASE;  Surgeon: Magnus Sinning, MD;  Location: [facility];  Service: Orthopedics;  Laterality: Left;  . CYSTOSCOPY W/ URETERAL STENT PLACEMENT  07/14/2012   Procedure: CYSTOSCOPY WITH RETROGRADE PYELOGRAM/URETERAL STENT PLACEMENT;  Surgeon: Ailene Rud, MD;  Location: [facility];  Service: Urology;  Laterality: Right;  . FINGER ARTHROPLASTY Left 10/30/2012   Procedure: thumb ARTHROPLASTY;  Surgeon: Magnus Sinning, MD;  Location: [facility];  Service: Orthopedics;  Laterality: Left;  carpometacarpal arthroplasty thumb  . HOLMIUM LASER APPLICATION  96/29/5284   Procedure: HOLMIUM LASER APPLICATION;  Surgeon: Ailene Rud, MD;  Location: [facility];  Service: Urology;  Laterality: Right;  . Holmium laser lithotripsy for right ureteral stone  6/12   by DrGrapey  . LAMINECTOMY AND MICRODISCECTOMY LUMBAR SPINE  02/2002   Dr. Collier Salina  . Retropubic suburethral sling for urinary incontinence  6/12    by DrGrapey  . right rotator cuff surgery  04/2005   Dr. Collier Salina   Family History  Problem Relation Age of Onset  . Heart failure Mother 73  . Liver cancer  Brother 25   Social History   Socioeconomic History  . Marital status: Widowed    Spouse name: Not on file  . Number of children: 1  . Years of education: Not on file  . Highest education level: Not on file  Occupational History  . Not on file  Social Needs  . Financial resource strain: Not hard at all  . Food insecurity:    Worry: Never true    Inability: Never true  . Transportation needs:    Medical: No    Non-medical: No  Tobacco Use  . Smoking status: Never Smoker  . Smokeless tobacco: Never  Used  Substance and Sexual Activity  . Alcohol use: No  . Drug use: No  . Sexual activity: Never  Lifestyle  . Physical activity:    Days per week: 2 days    Minutes per session: 40 min  . Stress: Not at all  Relationships  . Social connections:    Talks on phone: More than three times a week    Gets together: More than three times a week    Attends religious service: More than 4 times per year    Active member of club or organization: Yes    Attends meetings of clubs or organizations: More than 4 times per year    Relationship status: Widowed  Other Topics Concern  . Not on file  Social History Narrative   Widowed since 2006   Single, lives alone   Close with sister Marko Plume and her g-kids    Outpatient Encounter Medications as of 06/27/2018  Medication Sig  . acyclovir (ZOVIRAX) 400 MG tablet TAKE 1 TABLET THREE TIMES A DAY FOR 5 DAYS AS DIRECTED FOR FEVER BLISTERS  . aspirin 81 MG tablet Take 81 mg by mouth daily.    . Calcium Carbonate-Vit D-Min (CALTRATE 600+D PLUS) 600-400 MG-UNIT per tablet Take 1 tablet by mouth daily. Reported on 03/14/2016  . cholecalciferol (VITAMIN D) 1000 UNITS tablet Take 1,000 Units by mouth daily. Reported on 03/14/2016  . donepezil (ARICEPT) 10 MG tablet TAKE 1 TABLET BY MOUTH EVERYDAY AT BEDTIME  . ipratropium (ATROVENT) 0.03 % nasal spray Place 2 sprays into both nostrils 2 (two) times daily. Do not use for more than 5days.  Marland Kitchen losartan-hydrochlorothiazide (HYZAAR) 100-25 MG tablet TAKE 1 TABLET BY MOUTH EVERY DAY  . metoprolol succinate (TOPROL-XL) 50 MG 24 hr tablet TAKE 1 TABLET BY MOUTH DAILY WITH OR IMMEDIATELY FOLLOWING A MEAL  . Multiple Vitamins-Minerals (WOMENS MULTIVITAMIN PLUS) TABS Take 1 tablet by mouth daily.    . simvastatin (ZOCOR) 20 MG tablet TAKE 1 TABLET BY MOUTH EVERY DAY AT 6PM  . vitamin C (ASCORBIC ACID) 500 MG tablet Take 500 mg by mouth daily. Reported on 03/14/2016   No facility-administered encounter medications  on file as of 06/27/2018.     Activities of Daily Living In your present state of health, do you have any difficulty performing the following activities: 06/27/2018  Hearing? N  Vision? N  Difficulty concentrating or making decisions? N  Walking or climbing stairs? N  Dressing or bathing? N  Doing errands, shopping? N  Preparing Food and eating ? N  Using the Toilet? N  In the past six months, have you accidently leaked urine? N  Do you have problems with loss of bowel control? N  Managing your Medications? N  Managing your Finances? N  Housekeeping or managing your Housekeeping? N  Some recent data might be hidden  Patient Care Team: Hoyt Koch, MD as PCP - General (Internal Medicine) Rana Snare, MD (Urology) Bobbye Charleston, MD (Obstetrics and Gynecology) Ladene Artist, MD (Gastroenterology) Monna Fam, MD (Ophthalmology)    Assessment:   This is a routine wellness examination for [facility]. Physical assessment deferred to PCP.   Exercise Activities and Dietary recommendations Current Exercise Habits: Home exercise routine, Type of exercise: walking, Time (Minutes): 30, Frequency (Times/Week): 3, Weekly Exercise (Minutes/Week): 90, Intensity: Mild, Exercise limited by: None identified  Diet (meal preparation, eat out, water intake, caffeinated beverages, dairy products, fruits and vegetables): in general, a "healthy" diet  , well balanced   Reviewed heart healthy and diabetic diet. Encouraged patient to increase daily water and healthy fluid intake.  Goals    . Patient Stated     Continue to be active physically and in church. Love family and enjoy life.       Fall Risk Fall Risk  06/27/2018 06/19/2017 09/15/2015 06/04/2014  Falls in the past year? No No No No    Depression Screen PHQ 2/9 Scores 06/27/2018 06/19/2017 09/15/2015 06/04/2014  PHQ - 2 Score 0 0 0 0     Cognitive Function MMSE - Mini Mental State Exam 06/27/2018  Orientation to  time 3  Orientation to Place 5  Registration 3  Attention/ Calculation 5  Recall 1  Language- name 2 objects 2  Language- repeat 1  Language- follow 3 step command 3  Language- read & follow direction 1  Write a sentence 1  Copy design 1  Total score 26        Immunization History  Administered Date(s) Administered  . Influenza Split 06/30/2011, 06/07/2012  . Influenza Whole 06/07/2010  . Influenza, High Dose Seasonal PF 06/04/2014, 06/09/2016, 06/19/2017, 06/27/2018  . Influenza,inj,Quad PF,6+ Mos 06/01/2015  . Influenza-Unspecified 07/05/2013  . Pneumococcal Conjugate-13 09/15/2015  . Pneumococcal Polysaccharide-23 09/22/2008  . Td 09/22/2008  . Zoster 08/18/2013   Screening Tests Health Maintenance  Topic Date Due  . OPHTHALMOLOGY EXAM  06/14/1951  . HEMOGLOBIN A1C  12/18/2017  . TETANUS/TDAP  09/22/2018  . FOOT EXAM  12/19/2018  . INFLUENZA VACCINE  Completed  . DEXA SCAN  Completed  . PNA vac Low Risk Adult  Completed      Plan:     Continue doing brain stimulating activities (puzzles, reading, adult coloring books, staying active) to keep memory sharp.   Continue to eat heart healthy diet (full of fruits, vegetables, whole grains, lean protein, water--limit salt, fat, and sugar intake) and increase physical activity as tolerated.  I have personally reviewed and noted the following in the patient's chart:   . Medical and social history . Use of alcohol, tobacco or illicit drugs  . Current medications and supplements . Functional ability and status . Nutritional status . Physical activity . Advanced directives . List of other physicians . Vitals . Screenings to include cognitive, depression, and falls . Referrals and appointments  In addition, I have reviewed and discussed with patient certain preventive protocols, quality metrics, and best practice recommendations. A written personalized care plan for preventive services as well as general preventive  health recommendations were provided to patient.     Michiel Cowboy, RN  06/27/2018

## 2018-06-27 ENCOUNTER — Ambulatory Visit (INDEPENDENT_AMBULATORY_CARE_PROVIDER_SITE_OTHER): Payer: PPO | Admitting: *Deleted

## 2018-06-27 VITALS — BP 121/72 | HR 69 | Resp 18 | Ht 60.0 in | Wt 139.0 lb

## 2018-06-27 DIAGNOSIS — Z23 Encounter for immunization: Secondary | ICD-10-CM

## 2018-06-27 DIAGNOSIS — Z0000 Encounter for general adult medical examination without abnormal findings: Secondary | ICD-10-CM

## 2018-06-27 NOTE — Progress Notes (Signed)
Medical screening examination/treatment/procedure(s) were performed by non-physician practitioner and as supervising physician I was immediately available for consultation/collaboration. I agree with above. Arrian Manson A Janyla Biscoe, MD 

## 2018-06-27 NOTE — Patient Instructions (Addendum)
Please ask Dr. Herbert Deaner to send eye exam results to Dr. Sharlet Salina after your upcoming appointment Fax# [phone].  Continue doing brain stimulating activities (puzzles, reading, adult coloring books, staying active) to keep memory sharp.   Continue to eat heart healthy diet (full of fruits, vegetables, whole grains, lean protein, water--limit salt, fat, and sugar intake) and increase physical activity as tolerated.  Mary [name] , Thank you for taking time to come for your Medicare Wellness Visit. I appreciate your ongoing commitment to your health goals. Please review the following plan we discussed and let me know if I can assist you in the future.   These are the goals we discussed: Goals    . Patient Stated     Continue to be active physically and in church. Love family and enjoy life.       This is a list of the screening recommended for you and due dates:  Health Maintenance  Topic Date Due  . Eye exam for diabetics  06/14/1951  . Hemoglobin A1C  12/18/2017  . Flu Shot  04/04/2018  . Tetanus Vaccine  09/22/2018  . Complete foot exam   12/19/2018  . DEXA scan (bone density measurement)  Completed  . Pneumonia vaccines  Completed   Influenza Virus Vaccine injection What is this medicine? INFLUENZA VIRUS VACCINE (in floo EN zuh VAHY ruhs vak SEEN) helps to reduce the risk of getting influenza also known as the flu. The vaccine only helps protect you against some strains of the flu. This medicine may be used for other purposes; ask your health care provider or pharmacist if you have questions. COMMON BRAND NAME(S): Afluria, Agriflu, Alfuria, FLUAD, Fluarix, Fluarix Quadrivalent, Flublok, Flublok Quadrivalent, FLUCELVAX, Flulaval, Fluvirin, Fluzone, Fluzone High-Dose, Fluzone Intradermal What should I tell my health care provider before I take this medicine? They need to know if you have any of these conditions: -bleeding disorder like hemophilia -fever or infection -Guillain-Barre  syndrome or other neurological problems -immune system problems -infection with the human immunodeficiency virus (HIV) or AIDS -low blood platelet counts -multiple sclerosis -an unusual or allergic reaction to influenza virus vaccine, latex, other medicines, foods, dyes, or preservatives. Different brands of vaccines contain different allergens. Some may contain latex or eggs. Talk to your doctor about your allergies to make sure that you get the right vaccine. -pregnant or trying to get pregnant -breast-feeding How should I use this medicine? This vaccine is for injection into a muscle or under the skin. It is given by a health care professional. A copy of Vaccine Information Statements will be given before each vaccination. Read this sheet carefully each time. The sheet may change frequently. Talk to your healthcare provider to see which vaccines are right for you. Some vaccines should not be used in all age groups. Overdosage: If you think you have taken too much of this medicine contact a poison control center or emergency room at once. NOTE: This medicine is only for you. Do not share this medicine with others. What if I miss a dose? This does not apply. What may interact with this medicine? -chemotherapy or radiation therapy -medicines that lower your immune system like etanercept, anakinra, infliximab, and adalimumab -medicines that treat or prevent blood clots like warfarin -phenytoin -steroid medicines like prednisone or cortisone -theophylline -vaccines This list may not describe all possible interactions. Give your health care provider a list of all the medicines, herbs, non-prescription drugs, or dietary supplements you use. Also tell them if you smoke, drink  alcohol, or use illegal drugs. Some items may interact with your medicine. What should I watch for while using this medicine? Report any side effects that do not go away within 3 days to your doctor or health care  professional. Call your health care provider if any unusual symptoms occur within 6 weeks of receiving this vaccine. You may still catch the flu, but the illness is not usually as bad. You cannot get the flu from the vaccine. The vaccine will not protect against colds or other illnesses that may cause fever. The vaccine is needed every year. What side effects may I notice from receiving this medicine? Side effects that you should report to your doctor or health care professional as soon as possible: -allergic reactions like skin rash, itching or hives, swelling of the face, lips, or tongue Side effects that usually do not require medical attention (report to your doctor or health care professional if they continue or are bothersome): -fever -headache -muscle aches and pains -pain, tenderness, redness, or swelling at the injection site -tiredness This list may not describe all possible side effects. Call your doctor for medical advice about side effects. You may report side effects to FDA at 1-800-FDA-1088. Where should I keep my medicine? The vaccine will be given by a health care professional in a clinic, pharmacy, doctor's office, or other health care setting. You will not be given vaccine doses to store at home. NOTE: This sheet is a summary. It may not cover all possible information. If you have questions about this medicine, talk to your doctor, pharmacist, or health care provider.  2018 Elsevier/Gold Standard (2015-03-12 10:07:28)  Health Maintenance, Female Adopting a healthy lifestyle and getting preventive care can go a long way to promote health and wellness. Talk with your health care provider about what schedule of regular examinations is right for you. This is a good chance for you to check in with your provider about disease prevention and staying healthy. In between checkups, there are plenty of things you can do on your own. Experts have done a lot of research about which  lifestyle changes and preventive measures are most likely to keep you healthy. Ask your health care provider for more information. Weight and diet Eat a healthy diet  Be sure to include plenty of vegetables, fruits, low-fat dairy products, and lean protein.  Do not eat a lot of foods high in solid fats, added sugars, or salt.  Get regular exercise. This is one of the most important things you can do for your health. ? Most adults should exercise for at least 150 minutes each week. The exercise should increase your heart rate and make you sweat (moderate-intensity exercise). ? Most adults should also do strengthening exercises at least twice a week. This is in addition to the moderate-intensity exercise.  Maintain a healthy weight  Body mass index (BMI) is a measurement that can be used to identify possible weight problems. It estimates body fat based on height and weight. Your health care provider can help determine your BMI and help you achieve or maintain a healthy weight.  For females 101 years of age and older: ? A BMI below 18.5 is considered underweight. ? A BMI of 18.5 to 24.9 is normal. ? A BMI of 25 to 29.9 is considered overweight. ? A BMI of 30 and above is considered obese.  Watch levels of cholesterol and blood lipids  You should start having your blood tested for lipids and cholesterol at  76 years of age, then have this test every 5 years.  You may need to have your cholesterol levels checked more often if: ? Your lipid or cholesterol levels are high. ? You are older than 76 years of age. ? You are at high risk for heart disease.  Cancer screening Lung Cancer  Lung cancer screening is recommended for adults 56-27 years old who are at high risk for lung cancer because of a history of smoking.  A yearly low-dose CT scan of the lungs is recommended for people who: ? Currently smoke. ? Have quit within the past 15 years. ? Have at least a 30-pack-year history of  smoking. A pack year is smoking an average of one pack of cigarettes a day for 1 year.  Yearly screening should continue until it has been 15 years since you quit.  Yearly screening should stop if you develop a health problem that would prevent you from having lung cancer treatment.  Breast Cancer  Practice breast self-awareness. This means understanding how your breasts normally appear and feel.  It also means doing regular breast self-exams. Let your health care provider know about any changes, no matter how small.  If you are in your 20s or 30s, you should have a clinical breast exam (CBE) by a health care provider every 1-3 years as part of a regular health exam.  If you are 58 or older, have a CBE every year. Also consider having a breast X-ray (mammogram) every year.  If you have a family history of breast cancer, talk to your health care provider about genetic screening.  If you are at high risk for breast cancer, talk to your health care provider about having an MRI and a mammogram every year.  Breast cancer gene (BRCA) assessment is recommended for women who have family members with BRCA-related cancers. BRCA-related cancers include: ? Breast. ? Ovarian. ? Tubal. ? Peritoneal cancers.  Results of the assessment will determine the need for genetic counseling and BRCA1 and BRCA2 testing.  Cervical Cancer Your health care provider may recommend that you be screened regularly for cancer of the pelvic organs (ovaries, uterus, and vagina). This screening involves a pelvic examination, including checking for microscopic changes to the surface of your cervix (Pap test). You may be encouraged to have this screening done every 3 years, beginning at age 45.  For women ages 70-65, health care providers may recommend pelvic exams and Pap testing every 3 years, or they may recommend the Pap and pelvic exam, combined with testing for human papilloma virus (HPV), every 5 years. Some types of  HPV increase your risk of cervical cancer. Testing for HPV may also be done on women of any age with unclear Pap test results.  Other health care providers may not recommend any screening for nonpregnant women who are considered low risk for pelvic cancer and who do not have symptoms. Ask your health care provider if a screening pelvic exam is right for you.  If you have had past treatment for cervical cancer or a condition that could lead to cancer, you need Pap tests and screening for cancer for at least 20 years after your treatment. If Pap tests have been discontinued, your risk factors (such as having a new sexual partner) need to be reassessed to determine if screening should resume. Some women have medical problems that increase the chance of getting cervical cancer. In these cases, your health care provider may recommend more frequent screening and Pap  tests.  Colorectal Cancer  This type of cancer can be detected and often prevented.  Routine colorectal cancer screening usually begins at 76 years of age and continues through 76 years of age.  Your health care provider may recommend screening at an earlier age if you have risk factors for colon cancer.  Your health care provider may also recommend using home test kits to check for hidden blood in the stool.  A small camera at the end of a tube can be used to examine your colon directly (sigmoidoscopy or colonoscopy). This is done to check for the earliest forms of colorectal cancer.  Routine screening usually begins at age 90.  Direct examination of the colon should be repeated every 5-10 years through 76 years of age. However, you may need to be screened more often if early forms of precancerous polyps or small growths are found.  Skin Cancer  Check your skin from head to toe regularly.  Tell your health care provider about any new moles or changes in moles, especially if there is a change in a mole's shape or color.  Also tell  your health care provider if you have a mole that is larger than the size of a pencil eraser.  Always use sunscreen. Apply sunscreen liberally and repeatedly throughout the day.  Protect yourself by wearing long sleeves, pants, a wide-brimmed hat, and sunglasses whenever you are outside.  Heart disease, diabetes, and high blood pressure  High blood pressure causes heart disease and increases the risk of stroke. High blood pressure is more likely to develop in: ? People who have blood pressure in the high end of the normal range (130-139/85-89 mm Hg). ? People who are overweight or obese. ? People who are African American.  If you are 47-28 years of age, have your blood pressure checked every 3-5 years. If you are 25 years of age or older, have your blood pressure checked every year. You should have your blood pressure measured twice-once when you are at a hospital or clinic, and once when you are not at a hospital or clinic. Record the average of the two measurements. To check your blood pressure when you are not at a hospital or clinic, you can use: ? An automated blood pressure machine at a pharmacy. ? A home blood pressure monitor.  If you are between 65 years and 51 years old, ask your health care provider if you should take aspirin to prevent strokes.  Have regular diabetes screenings. This involves taking a blood sample to check your fasting blood sugar level. ? If you are at a normal weight and have a low risk for diabetes, have this test once every three years after 76 years of age. ? If you are overweight and have a high risk for diabetes, consider being tested at a younger age or more often. Preventing infection Hepatitis B  If you have a higher risk for hepatitis B, you should be screened for this virus. You are considered at high risk for hepatitis B if: ? You were born in a country where hepatitis B is common. Ask your health care provider which countries are considered high  risk. ? Your parents were born in a high-risk country, and you have not been immunized against hepatitis B (hepatitis B vaccine). ? You have HIV or AIDS. ? You use needles to inject street drugs. ? You live with someone who has hepatitis B. ? You have had sex with someone who has hepatitis  B. ? You get hemodialysis treatment. ? You take certain medicines for conditions, including cancer, organ transplantation, and autoimmune conditions.  Hepatitis C  Blood testing is recommended for: ? Everyone born from [birth year] through 1964. ? Anyone with known risk factors for hepatitis C.  Sexually transmitted infections (STIs)  You should be screened for sexually transmitted infections (STIs) including gonorrhea and chlamydia if: ? You are sexually active and are younger than 75 years of age. ? You are older than 76 years of age and your health care provider tells you that you are at risk for this type of infection. ? Your sexual activity has changed since you were last screened and you are at an increased risk for chlamydia or gonorrhea. Ask your health care provider if you are at risk.  If you do not have HIV, but are at risk, it may be recommended that you take a prescription medicine daily to prevent HIV infection. This is called pre-exposure prophylaxis (PrEP). You are considered at risk if: ? You are sexually active and do not regularly use condoms or know the HIV status of your partner(s). ? You take drugs by injection. ? You are sexually active with a partner who has HIV.  Talk with your health care provider about whether you are at high risk of being infected with HIV. If you choose to begin PrEP, you should first be tested for HIV. You should then be tested every 3 months for as long as you are taking PrEP. Pregnancy  If you are premenopausal and you may become pregnant, ask your health care provider about preconception counseling.  If you may become pregnant, take 400 to 800 micrograms  (mcg) of folic acid every day.  If you want to prevent pregnancy, talk to your health care provider about birth control (contraception). Osteoporosis and menopause  Osteoporosis is a disease in which the bones lose minerals and strength with aging. This can result in serious bone fractures. Your risk for osteoporosis can be identified using a bone density scan.  If you are 59 years of age or older, or if you are at risk for osteoporosis and fractures, ask your health care provider if you should be screened.  Ask your health care provider whether you should take a calcium or vitamin D supplement to lower your risk for osteoporosis.  Menopause may have certain physical symptoms and risks.  Hormone replacement therapy may reduce some of these symptoms and risks. Talk to your health care provider about whether hormone replacement therapy is right for you. Follow these instructions at home:  Schedule regular health, dental, and eye exams.  Stay current with your immunizations.  Do not use any tobacco products including cigarettes, chewing tobacco, or electronic cigarettes.  If you are pregnant, do not drink alcohol.  If you are breastfeeding, limit how much and how often you drink alcohol.  Limit alcohol intake to no more than 1 drink per day for nonpregnant women. One drink equals 12 ounces of beer, 5 ounces of Yanelli Zapanta, or 1 ounces of hard liquor.  Do not use street drugs.  Do not share needles.  Ask your health care provider for help if you need support or information about quitting drugs.  Tell your health care provider if you often feel depressed.  Tell your health care provider if you have ever been abused or do not feel safe at home. This information is not intended to replace advice given to you by your health care provider. Make  sure you discuss any questions you have with your health care provider. Document Released: 03/06/2011 Document Revised: 01/27/2016 Document Reviewed:  05/25/2015 Elsevier Interactive Patient Education  [employer].

## 2018-06-30 ENCOUNTER — Other Ambulatory Visit: Payer: Self-pay | Admitting: Internal Medicine

## 2018-07-02 DIAGNOSIS — H35363 Drusen (degenerative) of macula, bilateral: Secondary | ICD-10-CM | POA: Diagnosis not present

## 2018-07-02 DIAGNOSIS — H2513 Age-related nuclear cataract, bilateral: Secondary | ICD-10-CM | POA: Diagnosis not present

## 2018-07-02 DIAGNOSIS — H25013 Cortical age-related cataract, bilateral: Secondary | ICD-10-CM | POA: Diagnosis not present

## 2018-07-02 DIAGNOSIS — D3131 Benign neoplasm of right choroid: Secondary | ICD-10-CM | POA: Diagnosis not present

## 2018-07-02 LAB — HM DIABETES EYE EXAM

## 2018-09-29 ENCOUNTER — Other Ambulatory Visit: Payer: Self-pay | Admitting: Internal Medicine

## 2018-12-09 ENCOUNTER — Other Ambulatory Visit: Payer: Self-pay | Admitting: Internal Medicine

## 2018-12-10 ENCOUNTER — Other Ambulatory Visit: Payer: Self-pay | Admitting: Internal Medicine

## 2018-12-28 ENCOUNTER — Other Ambulatory Visit: Payer: Self-pay | Admitting: Internal Medicine

## 2019-01-25 ENCOUNTER — Other Ambulatory Visit: Payer: Self-pay | Admitting: Internal Medicine

## 2019-02-14 ENCOUNTER — Other Ambulatory Visit: Payer: Self-pay | Admitting: Internal Medicine

## 2019-02-25 ENCOUNTER — Ambulatory Visit: Payer: PPO | Admitting: Internal Medicine

## 2019-03-01 ENCOUNTER — Other Ambulatory Visit: Payer: Self-pay | Admitting: Internal Medicine

## 2019-03-03 ENCOUNTER — Telehealth: Payer: Self-pay | Admitting: Internal Medicine

## 2019-03-03 ENCOUNTER — Other Ambulatory Visit: Payer: Self-pay | Admitting: Internal Medicine

## 2019-03-03 MED ORDER — SIMVASTATIN 20 MG PO TABS: 20.0000 mg | ORAL_TABLET | Freq: Every day | ORAL | 0 refills | Status: DC

## 2019-03-03 NOTE — Telephone Encounter (Signed)
rx sent

## 2019-03-03 NOTE — Telephone Encounter (Signed)
As long as symptom free can make a annual appointment

## 2019-03-03 NOTE — Telephone Encounter (Signed)
Patient is requesting to schedule her yearly appt?  Please advise.

## 2019-03-03 NOTE — Telephone Encounter (Signed)
Patient is out of simvastatin.  She is requesting a refill to be sent in due to being out of this medication.  Patient has CPE scheduled.

## 2019-03-03 NOTE — Telephone Encounter (Signed)
Can fill 90 day supply but needs visit before more refills either virtual or in person

## 2019-03-06 ENCOUNTER — Ambulatory Visit (INDEPENDENT_AMBULATORY_CARE_PROVIDER_SITE_OTHER): Payer: PPO | Admitting: Internal Medicine

## 2019-03-06 ENCOUNTER — Encounter: Payer: Self-pay | Admitting: Internal Medicine

## 2019-03-06 ENCOUNTER — Other Ambulatory Visit: Payer: Self-pay

## 2019-03-06 ENCOUNTER — Other Ambulatory Visit (INDEPENDENT_AMBULATORY_CARE_PROVIDER_SITE_OTHER): Payer: PPO

## 2019-03-06 VITALS — BP 140/90 | HR 79 | Temp 98.1°F | Ht 60.0 in | Wt 141.0 lb

## 2019-03-06 DIAGNOSIS — Z23 Encounter for immunization: Secondary | ICD-10-CM | POA: Diagnosis not present

## 2019-03-06 DIAGNOSIS — G3184 Mild cognitive impairment, so stated: Secondary | ICD-10-CM | POA: Diagnosis not present

## 2019-03-06 DIAGNOSIS — E785 Hyperlipidemia, unspecified: Secondary | ICD-10-CM | POA: Diagnosis not present

## 2019-03-06 DIAGNOSIS — E119 Type 2 diabetes mellitus without complications: Secondary | ICD-10-CM

## 2019-03-06 DIAGNOSIS — Z0000 Encounter for general adult medical examination without abnormal findings: Secondary | ICD-10-CM | POA: Diagnosis not present

## 2019-03-06 DIAGNOSIS — I10 Essential (primary) hypertension: Secondary | ICD-10-CM

## 2019-03-06 DIAGNOSIS — R911 Solitary pulmonary nodule: Secondary | ICD-10-CM

## 2019-03-06 DIAGNOSIS — E1169 Type 2 diabetes mellitus with other specified complication: Secondary | ICD-10-CM

## 2019-03-06 LAB — COMPREHENSIVE METABOLIC PANEL
ALT: 23 U/L (ref 0–35)
AST: 21 U/L (ref 0–37)
Albumin: 4.3 g/dL (ref 3.5–5.2)
Alkaline Phosphatase: 64 U/L (ref 39–117)
BUN: 15 mg/dL (ref 6–23)
CO2: 28 mEq/L (ref 19–32)
Calcium: 10.9 mg/dL — ABNORMAL HIGH (ref 8.4–10.5)
Chloride: 101 mEq/L (ref 96–112)
Creatinine, Ser: 1.14 mg/dL (ref 0.40–1.20)
GFR: 46.25 mL/min — ABNORMAL LOW (ref >60.00)
Glucose, Bld: 112 mg/dL — ABNORMAL HIGH (ref 70–99)
Potassium: 3.3 mEq/L — ABNORMAL LOW (ref 3.5–5.1)
Sodium: 140 mEq/L (ref 135–145)
Total Bilirubin: 0.5 mg/dL (ref 0.2–1.2)
Total Protein: 7.1 g/dL (ref 6.0–8.3)

## 2019-03-06 LAB — LIPID PANEL
Cholesterol: 242 mg/dL — ABNORMAL HIGH (ref 0–200)
HDL: 57.5 mg/dL (ref >39.00)
NonHDL: 184.02
Total CHOL/HDL Ratio: 4
Triglycerides: 252 mg/dL — ABNORMAL HIGH (ref 0.0–149.0)
VLDL: 50.4 mg/dL — ABNORMAL HIGH (ref 0.0–40.0)

## 2019-03-06 LAB — CBC
HCT: 44.7 % (ref 36.0–46.0)
Hemoglobin: 14.9 g/dL (ref 12.0–15.0)
MCHC: 33.4 g/dL (ref 30.0–36.0)
MCV: 94.8 fl (ref 78.0–100.0)
Platelets: 268 10*3/uL (ref 150.0–400.0)
RBC: 4.71 Mil/uL (ref 3.87–5.11)
RDW: 13.8 % (ref 11.5–15.5)
WBC: 9.7 10*3/uL (ref 4.0–10.5)

## 2019-03-06 LAB — LDL CHOLESTEROL, DIRECT: Direct LDL: 150 mg/dL

## 2019-03-06 LAB — HEMOGLOBIN A1C: Hgb A1c MFr Bld: 6.6 % — ABNORMAL HIGH (ref 4.6–6.5)

## 2019-03-06 MED ORDER — METOPROLOL SUCCINATE ER 50 MG PO TB24: 50.0000 mg | ORAL_TABLET | Freq: Every day | ORAL | 3 refills | Status: DC

## 2019-03-06 MED ORDER — DONEPEZIL HCL 10 MG PO TABS: 10.0000 mg | ORAL_TABLET | Freq: Every day | ORAL | 3 refills | Status: DC

## 2019-03-06 MED ORDER — HYDROCHLOROTHIAZIDE 25 MG PO TABS: 25.0000 mg | ORAL_TABLET | Freq: Every day | ORAL | 3 refills | Status: DC

## 2019-03-06 MED ORDER — SIMVASTATIN 20 MG PO TABS: 20.0000 mg | ORAL_TABLET | Freq: Every day | ORAL | 3 refills | Status: DC

## 2019-03-06 MED ORDER — LOSARTAN POTASSIUM 100 MG PO TABS: 100.0000 mg | ORAL_TABLET | Freq: Every day | ORAL | 3 refills | Status: DC

## 2019-03-06 NOTE — Patient Instructions (Signed)
Health Maintenance, Female Adopting a healthy lifestyle and getting preventive care are important in promoting health and wellness. Ask your health care provider about:  The right schedule for you to have regular tests and exams.  Things you can do on your own to prevent diseases and keep yourself healthy. What should I know about diet, weight, and exercise? Eat a healthy diet   Eat a diet that includes plenty of vegetables, fruits, low-fat dairy products, and lean protein.  Do not eat a lot of foods that are high in solid fats, added sugars, or sodium. Maintain a healthy weight Body mass index (BMI) is used to identify weight problems. It estimates body fat based on height and weight. Your health care provider can help determine your BMI and help you achieve or maintain a healthy weight. Get regular exercise Get regular exercise. This is one of the most important things you can do for your health. Most adults should:  Exercise for at least 150 minutes each week. The exercise should increase your heart rate and make you sweat (moderate-intensity exercise).  Do strengthening exercises at least twice a week. This is in addition to the moderate-intensity exercise.  Spend less time sitting. Even light physical activity can be beneficial. Watch cholesterol and blood lipids Have your blood tested for lipids and cholesterol at 77 years of age, then have this test every 5 years. Have your cholesterol levels checked more often if:  Your lipid or cholesterol levels are high.  You are older than 77 years of age.  You are at high risk for heart disease. What should I know about cancer screening? Depending on your health history and family history, you may need to have cancer screening at various ages. This may include screening for:  Breast cancer.  Cervical cancer.  Colorectal cancer.  Skin cancer.  Lung cancer. What should I know about heart disease, diabetes, and high blood  pressure? Blood pressure and heart disease  High blood pressure causes heart disease and increases the risk of stroke. This is more likely to develop in people who have high blood pressure readings, are of African descent, or are overweight.  Have your blood pressure checked: ? Every 3-5 years if you are 18-39 years of age. ? Every year if you are 40 years old or older. Diabetes Have regular diabetes screenings. This checks your fasting blood sugar level. Have the screening done:  Once every three years after age 40 if you are at a normal weight and have a low risk for diabetes.  More often and at a younger age if you are overweight or have a high risk for diabetes. What should I know about preventing infection? Hepatitis B If you have a higher risk for hepatitis B, you should be screened for this virus. Talk with your health care provider to find out if you are at risk for hepatitis B infection. Hepatitis C Testing is recommended for:  Everyone born from 1945 through 1965.  Anyone with known risk factors for hepatitis C. Sexually transmitted infections (STIs)  Get screened for STIs, including gonorrhea and chlamydia, if: ? You are sexually active and are younger than 77 years of age. ? You are older than 77 years of age and your health care provider tells you that you are at risk for this type of infection. ? Your sexual activity has changed since you were last screened, and you are at increased risk for chlamydia or gonorrhea. Ask your health care provider if   you are at risk.  Ask your health care provider about whether you are at high risk for HIV. Your health care provider may recommend a prescription medicine to help prevent HIV infection. If you choose to take medicine to prevent HIV, you should first get tested for HIV. You should then be tested every 3 months for as long as you are taking the medicine. Pregnancy  If you are about to stop having your period (premenopausal) and  you may become pregnant, seek counseling before you get pregnant.  Take 400 to 800 micrograms (mcg) of folic acid every day if you become pregnant.  Ask for birth control (contraception) if you want to prevent pregnancy. Osteoporosis and menopause Osteoporosis is a disease in which the bones lose minerals and strength with aging. This can result in bone fractures. If you are 65 years old or older, or if you are at risk for osteoporosis and fractures, ask your health care provider if you should:  Be screened for bone loss.  Take a calcium or vitamin D supplement to lower your risk of fractures.  Be given hormone replacement therapy (HRT) to treat symptoms of menopause. Follow these instructions at home: Lifestyle  Do not use any products that contain nicotine or tobacco, such as cigarettes, e-cigarettes, and chewing tobacco. If you need help quitting, ask your health care provider.  Do not use street drugs.  Do not share needles.  Ask your health care provider for help if you need support or information about quitting drugs. Alcohol use  Do not drink alcohol if: ? Your health care provider tells you not to drink. ? You are pregnant, may be pregnant, or are planning to become pregnant.  If you drink alcohol: ? Limit how much you use to 0-1 drink a day. ? Limit intake if you are breastfeeding.  Be aware of how much alcohol is in your drink. In the [place] one drink equals one 12 oz bottle of beer (355 mL), one 5 oz glass of wine (148 mL), or one 1 oz glass of hard liquor (44 mL). General instructions  Schedule regular health, dental, and eye exams.  Stay current with your vaccines.  Tell your health care provider if: ? You often feel depressed. ? You have ever been abused or do not feel safe at home. Summary  Adopting a healthy lifestyle and getting preventive care are important in promoting health and wellness.  Follow your health care provider's instructions about healthy  diet, exercising, and getting tested or screened for diseases.  Follow your health care provider's instructions on monitoring your cholesterol and blood pressure. This information is not intended to replace advice given to you by your health care provider. Make sure you discuss any questions you have with your health care provider. Document Released: 03/06/2011 Document Revised: 08/14/2018 Document Reviewed: 08/14/2018 Elsevier Patient Education  2020 Elsevier Inc.  

## 2019-03-06 NOTE — Assessment & Plan Note (Signed)
Was recommended follow up of nodules back in 2012, she declines this today.

## 2019-03-06 NOTE — Progress Notes (Signed)
   Subjective:   Patient ID: Mary [name], female    DOB: 11-Aug-1941, 78 y.o.   MRN: [id number]  HPI The patient is a 78 YO female coming in for physical.   PMH, [place], social history reviewed and updated  Review of Systems  Constitutional: Negative.   HENT: Negative.   Eyes: Negative.   Respiratory: Negative for cough, chest tightness and shortness of breath.   Cardiovascular: Negative for chest pain, palpitations and leg swelling.  Gastrointestinal: Negative for abdominal distention, abdominal pain, constipation, diarrhea, nausea and vomiting.  Musculoskeletal: Negative.   Skin: Negative.   Neurological: Negative.   Psychiatric/Behavioral: Negative.     Objective:  Physical Exam Constitutional:      Appearance: She is well-developed.  HENT:     Head: Normocephalic and atraumatic.  Neck:     Musculoskeletal: Normal range of motion.  Cardiovascular:     Rate and Rhythm: Normal rate and regular rhythm.  Pulmonary:     Effort: Pulmonary effort is normal. No respiratory distress.     Breath sounds: Normal breath sounds. No wheezing or rales.  Abdominal:     General: Bowel sounds are normal. There is no distension.     Palpations: Abdomen is soft.     Tenderness: There is no abdominal tenderness. There is no rebound.  Skin:    General: Skin is warm and dry.     Comments: Foot exam done  Neurological:     Mental Status: She is alert and oriented to person, place, and time.     Coordination: Coordination normal.     Vitals:   03/06/19 1323  BP: 140/90  Pulse: 79  Temp: 98.1 F (36.7 C)  TempSrc: Oral  SpO2: 97%  Weight: 141 lb (64 kg)  Height: 5' (1.524 m)    Assessment & Plan:  Tdap given at visit

## 2019-03-06 NOTE — Assessment & Plan Note (Signed)
Weight down another 5 pounds since last year. On ARB and statin. Checking HgA1c and adjust as needed. Off meds for sugars currently.

## 2019-03-06 NOTE — Assessment & Plan Note (Signed)
Taking simvastatin 20 mg daily, checking lipid panel and adjust as needed.  

## 2019-03-06 NOTE — Assessment & Plan Note (Signed)
BP at goal on losartan/hctz and metoprolol. Checking CMP and adjust as needed.  

## 2019-03-06 NOTE — Assessment & Plan Note (Signed)
Flu shot counseled yearly. Pneumonia complete. Shingrix counseled. Tetanus given today. Colonoscopy aged out. Mammogram up to date, pap smear aged out and dexa up to date. Counseled about sun safety and mole surveillance. Counseled about the dangers of distracted driving. Given 10 year screening recommendations.

## 2019-03-06 NOTE — Assessment & Plan Note (Signed)
Taking aricept 10 mg daily and denies decline. MMSE recent 26 which is stable. Continue.

## 2019-03-10 ENCOUNTER — Encounter: Payer: Self-pay | Admitting: Internal Medicine

## 2019-03-10 NOTE — Progress Notes (Signed)
Abstracted and sent to scan  

## 2019-06-30 ENCOUNTER — Ambulatory Visit (INDEPENDENT_AMBULATORY_CARE_PROVIDER_SITE_OTHER): Payer: PPO | Admitting: *Deleted

## 2019-06-30 DIAGNOSIS — Z0000 Encounter for general adult medical examination without abnormal findings: Secondary | ICD-10-CM | POA: Diagnosis not present

## 2019-06-30 NOTE — Progress Notes (Signed)
Subjective:   Mary [name] is a 78 y.o. female who presents for Medicare Annual (Subsequent) preventive examination. I connected with patient by a telephone and verified that I am speaking with the correct person using two identifiers. Patient stated full name and DOB. Patient gave permission to continue with telephonic visit. Patient's location was at home and Nurse's location was at [place] office. Participants during this visit included patient and nurse.  Review of Systems:     Sleep patterns: feels rested on waking, gets up 0 times nightly to void and sleeps 7-8 hours nightly.   Home Safety/Smoke Alarms: Feels safe in home. Smoke alarms in place.  Living environment; residence and Firearm Safety: 1-story house/ trailer. Lives alone, no needs for DME, good support system Seat Belt Safety/Bike Helmet: Wears seat belt.    Objective:     Vitals: There were no vitals taken for this visit.  There is no height or weight on file to calculate BMI.  Advanced Directives 06/27/2018 10/30/2012 10/25/2012 07/13/2012  Does Patient Have a Medical Advance Directive? Yes Patient has advance directive, copy not in chart Patient has advance directive, copy not in chart Patient has advance directive, copy not in chart  Type of Advance Directive [place];Living will [place];Living will [place];Living will Living will  Copy of [place] in Chart? No - copy requested (No Data) - Copy requested from family  Pre-existing out of facility DNR order (yellow form or pink MOST form) - No - No    Tobacco Social History   Tobacco Use  Smoking Status Never Smoker  Smokeless Tobacco Never Used     Counseling given: Not Answered  Past Medical History:  Diagnosis Date  . Anxiety   . Carpal tunnel syndrome    bilateral  . DDD (degenerative disc disease)   . Diverticulosis of colon   . DJD (degenerative joint disease)   .  Eyelid cyst    removed from R eyelid 11/20/12 - opth   . GERD (gastroesophageal reflux disease)   . History of cyst of breast   . Hypercholesteremia   . Hypertension   . Kidney stones   . Osteoporosis   . Venous insufficiency    Past Surgical History:  Procedure Laterality Date  . CARPAL TUNNEL RELEASE Right 2009  . CARPAL TUNNEL RELEASE Left 10/30/2012   Procedure: CARPAL TUNNEL RELEASE;  Surgeon: Magnus Sinning, MD;  Location: [facility];  Service: Orthopedics;  Laterality: Left;  . CYSTOSCOPY W/ URETERAL STENT PLACEMENT  07/14/2012   Procedure: CYSTOSCOPY WITH RETROGRADE PYELOGRAM/URETERAL STENT PLACEMENT;  Surgeon: Ailene Rud, MD;  Location: [facility];  Service: Urology;  Laterality: Right;  . FINGER ARTHROPLASTY Left 10/30/2012   Procedure: thumb ARTHROPLASTY;  Surgeon: Magnus Sinning, MD;  Location: [facility];  Service: Orthopedics;  Laterality: Left;  carpometacarpal arthroplasty thumb  . HOLMIUM LASER APPLICATION  [id number]   Procedure: HOLMIUM LASER APPLICATION;  Surgeon: Ailene Rud, MD;  Location: [facility];  Service: Urology;  Laterality: Right;  . Holmium laser lithotripsy for right ureteral stone  6/12   by DrGrapey  . LAMINECTOMY AND MICRODISCECTOMY LUMBAR SPINE  02/2002   Dr. Collier Salina  . Retropubic suburethral sling for urinary incontinence  6/12    by DrGrapey  . right rotator cuff surgery  04/2005   Dr. Collier Salina   Family History  Problem Relation Age of Onset  . Heart failure  Mother 66  . Liver cancer Brother 85   Social History   Socioeconomic History  . Marital status: Widowed    Spouse name: Not on file  . Number of children: 1  . Years of education: Not on file  . Highest education level: Not on file  Occupational History  . Not on file  Social Needs  . Financial resource strain: Not hard at all  . Food insecurity    Worry: Never true    Inability: Never true  . Transportation needs     Medical: No    Non-medical: No  Tobacco Use  . Smoking status: Never Smoker  . Smokeless tobacco: Never Used  Substance and Sexual Activity  . Alcohol use: No  . Drug use: No  . Sexual activity: Never  Lifestyle  . Physical activity    Days per week: 2 days    Minutes per session: 40 min  . Stress: Not at all  Relationships  . Social connections    Talks on phone: More than three times a week    Gets together: More than three times a week    Attends religious service: More than 4 times per year    Active member of club or organization: Yes    Attends meetings of clubs or organizations: More than 4 times per year    Relationship status: Widowed  Other Topics Concern  . Not on file  Social History Narrative   Widowed since 2006   Single, lives alone   Close with sister Marko Plume and her g-kids    Outpatient Encounter Medications as of 06/30/2019  Medication Sig  . acyclovir (ZOVIRAX) 400 MG tablet TAKE 1 TABLET THREE TIMES A DAY FOR 5 DAYS AS DIRECTED FOR FEVER BLISTERS  . aspirin 81 MG tablet Take 81 mg by mouth daily.    . Calcium Carbonate-Vit D-Min (CALTRATE 600+D PLUS) 600-400 MG-UNIT per tablet Take 1 tablet by mouth daily. Reported on 03/14/2016  . cholecalciferol (VITAMIN D) 1000 UNITS tablet Take 1,000 Units by mouth daily. Reported on 03/14/2016  . donepezil (ARICEPT) 10 MG tablet Take 1 tablet (10 mg total) by mouth at bedtime.  . hydrochlorothiazide (HYDRODIURIL) 25 MG tablet Take 1 tablet (25 mg total) by mouth daily.  Marland Kitchen ipratropium (ATROVENT) 0.03 % nasal spray Place 2 sprays into both nostrils 2 (two) times daily. Do not use for more than 5days.  Marland Kitchen losartan (COZAAR) 100 MG tablet Take 1 tablet (100 mg total) by mouth daily.  . metoprolol succinate (TOPROL-XL) 50 MG 24 hr tablet Take 1 tablet (50 mg total) by mouth daily.  . Multiple Vitamins-Minerals (WOMENS MULTIVITAMIN PLUS) TABS Take 1 tablet by mouth daily.    . simvastatin (ZOCOR) 20 MG tablet Take 1  tablet (20 mg total) by mouth daily at 6 PM.  . vitamin C (ASCORBIC ACID) 500 MG tablet Take 500 mg by mouth daily. Reported on 03/14/2016   No facility-administered encounter medications on file as of 06/30/2019.     Activities of Daily Living No flowsheet data found.  Patient Care Team: Hoyt Koch, MD as PCP - General (Internal Medicine) Rana Snare, MD (Urology) Bobbye Charleston, MD (Obstetrics and Gynecology) Ladene Artist, MD (Gastroenterology) Monna Fam, MD (Ophthalmology)    Assessment:   This is a routine wellness examination for [facility]. Physical assessment deferred to PCP.  Exercise Activities and Dietary recommendations   Diet (meal preparation, eat out, water intake, caffeinated beverages, dairy products, fruits and vegetables):  in general, a "healthy" diet  , well balanced   Reviewed heart healthy and diabetic diet. Encouraged patient to increase daily water and healthy fluid intake.  Goals    . Patient Stated     Continue to be active physically and in church. Love family and enjoy life.       Fall Risk Fall Risk  06/27/2018 06/19/2017 09/15/2015 06/04/2014  Falls in the past year? No No No No   Is the patient's home free of loose throw rugs in walkways, pet beds, electrical cords, etc?   yes      Grab bars in the bathroom? yes      Handrails on the stairs?   yes      Adequate lighting?   yes  Depression Screen PHQ 2/9 Scores 06/27/2018 06/19/2017 09/15/2015 06/04/2014  PHQ - 2 Score 0 0 0 0     Cognitive Function MMSE - Mini Mental State Exam 06/27/2018  Orientation to time 3  Orientation to Place 5  Registration 3  Attention/ Calculation 5  Recall 1  Language- name 2 objects 2  Language- repeat 1  Language- follow 3 step command 3  Language- read & follow direction 1  Write a sentence 1  Copy design 1  Total score 26        Immunization History  Administered Date(s) Administered  . Influenza Split 06/30/2011, 06/07/2012   . Influenza Whole 06/07/2010  . Influenza, High Dose Seasonal PF 06/04/2014, 06/09/2016, 06/19/2017, 06/27/2018  . Influenza,inj,Quad PF,6+ Mos 06/01/2015  . Influenza-Unspecified 07/05/2013  . Pneumococcal Conjugate-13 09/15/2015  . Pneumococcal Polysaccharide-23 09/22/2008  . Td 09/22/2008  . Tdap 03/06/2019  . Zoster 08/18/2013   Screening Tests Health Maintenance  Topic Date Due  . INFLUENZA VACCINE  04/05/2019  . OPHTHALMOLOGY EXAM  07/03/2019  . HEMOGLOBIN A1C  09/06/2019  . FOOT EXAM  03/05/2020  . TETANUS/TDAP  03/05/2029  . DEXA SCAN  Completed  . PNA vac Low Risk Adult  Completed      Plan:    Reviewed health maintenance screenings with patient today and relevant education, vaccines, and/or referrals were provided.   Continue to eat heart healthy diet (full of fruits, vegetables, whole grains, lean protein, water--limit salt, fat, and sugar intake) and increase physical activity as tolerated.  Continue doing brain stimulating activities (puzzles, reading, adult coloring books, staying active) to keep memory sharp.   I have personally reviewed and noted the following in the patient's chart:   . Medical and social history . Use of alcohol, tobacco or illicit drugs  . Current medications and supplements . Functional ability and status . Nutritional status . Physical activity . Advanced directives . List of other physicians . Screenings to include cognitive, depression, and falls . Referrals and appointments  In addition, I have reviewed and discussed with patient certain preventive protocols, quality metrics, and best practice recommendations. A written personalized care plan for preventive services as well as general preventive health recommendations were provided to patient.     Michiel Cowboy, RN  06/30/2019

## 2019-06-30 NOTE — Progress Notes (Signed)
Medical screening examination/treatment/procedure(s) were performed by non-physician practitioner and as supervising physician I was immediately available for consultation/collaboration. I agree with above. Kamile Fassler A Merced Brougham, MD 

## 2019-07-04 DIAGNOSIS — H25041 Posterior subcapsular polar age-related cataract, right eye: Secondary | ICD-10-CM | POA: Diagnosis not present

## 2019-07-04 DIAGNOSIS — H2513 Age-related nuclear cataract, bilateral: Secondary | ICD-10-CM | POA: Diagnosis not present

## 2019-07-04 DIAGNOSIS — H52213 Irregular astigmatism, bilateral: Secondary | ICD-10-CM | POA: Diagnosis not present

## 2019-07-04 DIAGNOSIS — H35033 Hypertensive retinopathy, bilateral: Secondary | ICD-10-CM | POA: Diagnosis not present

## 2019-07-04 DIAGNOSIS — H35363 Drusen (degenerative) of macula, bilateral: Secondary | ICD-10-CM | POA: Diagnosis not present

## 2019-07-04 DIAGNOSIS — H25013 Cortical age-related cataract, bilateral: Secondary | ICD-10-CM | POA: Diagnosis not present

## 2019-07-04 DIAGNOSIS — H2512 Age-related nuclear cataract, left eye: Secondary | ICD-10-CM | POA: Diagnosis not present

## 2019-07-29 DIAGNOSIS — H25812 Combined forms of age-related cataract, left eye: Secondary | ICD-10-CM | POA: Diagnosis not present

## 2019-07-29 DIAGNOSIS — H2512 Age-related nuclear cataract, left eye: Secondary | ICD-10-CM | POA: Diagnosis not present

## 2019-08-18 DIAGNOSIS — H2511 Age-related nuclear cataract, right eye: Secondary | ICD-10-CM | POA: Diagnosis not present

## 2019-08-18 DIAGNOSIS — H25011 Cortical age-related cataract, right eye: Secondary | ICD-10-CM | POA: Diagnosis not present

## 2019-08-18 DIAGNOSIS — H25041 Posterior subcapsular polar age-related cataract, right eye: Secondary | ICD-10-CM | POA: Diagnosis not present

## 2019-08-26 DIAGNOSIS — H25041 Posterior subcapsular polar age-related cataract, right eye: Secondary | ICD-10-CM | POA: Diagnosis not present

## 2019-08-26 DIAGNOSIS — H25011 Cortical age-related cataract, right eye: Secondary | ICD-10-CM | POA: Diagnosis not present

## 2019-08-26 DIAGNOSIS — H2511 Age-related nuclear cataract, right eye: Secondary | ICD-10-CM | POA: Diagnosis not present

## 2020-03-09 ENCOUNTER — Ambulatory Visit (INDEPENDENT_AMBULATORY_CARE_PROVIDER_SITE_OTHER): Payer: PPO | Admitting: Internal Medicine

## 2020-03-09 ENCOUNTER — Encounter: Payer: Self-pay | Admitting: Internal Medicine

## 2020-03-09 ENCOUNTER — Other Ambulatory Visit: Payer: Self-pay

## 2020-03-09 VITALS — BP 126/84 | HR 98 | Temp 98.8°F | Ht 60.0 in | Wt 141.0 lb

## 2020-03-09 DIAGNOSIS — Z1159 Encounter for screening for other viral diseases: Secondary | ICD-10-CM

## 2020-03-09 DIAGNOSIS — E119 Type 2 diabetes mellitus without complications: Secondary | ICD-10-CM | POA: Diagnosis not present

## 2020-03-09 DIAGNOSIS — I10 Essential (primary) hypertension: Secondary | ICD-10-CM

## 2020-03-09 DIAGNOSIS — Z0000 Encounter for general adult medical examination without abnormal findings: Secondary | ICD-10-CM | POA: Diagnosis not present

## 2020-03-09 DIAGNOSIS — E042 Nontoxic multinodular goiter: Secondary | ICD-10-CM

## 2020-03-09 DIAGNOSIS — E1169 Type 2 diabetes mellitus with other specified complication: Secondary | ICD-10-CM | POA: Diagnosis not present

## 2020-03-09 DIAGNOSIS — G3184 Mild cognitive impairment, so stated: Secondary | ICD-10-CM

## 2020-03-09 DIAGNOSIS — E785 Hyperlipidemia, unspecified: Secondary | ICD-10-CM

## 2020-03-09 MED ORDER — LOSARTAN POTASSIUM 100 MG PO TABS: 100.0000 mg | ORAL_TABLET | Freq: Every day | ORAL | 3 refills | Status: DC

## 2020-03-09 MED ORDER — HYDROCHLOROTHIAZIDE 25 MG PO TABS: 25.0000 mg | ORAL_TABLET | Freq: Every day | ORAL | 3 refills | Status: DC

## 2020-03-09 MED ORDER — SIMVASTATIN 20 MG PO TABS: 20.0000 mg | ORAL_TABLET | Freq: Every day | ORAL | 3 refills | Status: DC

## 2020-03-09 MED ORDER — METOPROLOL SUCCINATE ER 50 MG PO TB24: 50.0000 mg | ORAL_TABLET | Freq: Every day | ORAL | 3 refills | Status: DC

## 2020-03-09 MED ORDER — DONEPEZIL HCL 10 MG PO TABS: 10.0000 mg | ORAL_TABLET | Freq: Every day | ORAL | 3 refills | Status: DC

## 2020-03-09 NOTE — Assessment & Plan Note (Signed)
Continue aricept 10mg daily.  

## 2020-03-09 NOTE — Assessment & Plan Note (Signed)
Checking lipid panel and adjust simvastatin as needed.  

## 2020-03-09 NOTE — Assessment & Plan Note (Signed)
Checking TSH today.

## 2020-03-09 NOTE — Progress Notes (Signed)
   Subjective:   Patient ID: Mary [name], female    DOB: 1941/05/17, 79 y.o.   MRN: [id number]  HPI The patient is a 79 YO female coming in for physical. No new concerns.  PMH, [facility], social history reviewed and updated.  Review of Systems  Constitutional: Negative.   HENT: Negative.   Eyes: Negative.   Respiratory: Negative for cough, chest tightness and shortness of breath.   Cardiovascular: Negative for chest pain, palpitations and leg swelling.  Gastrointestinal: Negative for abdominal distention, abdominal pain, constipation, diarrhea, nausea and vomiting.  Musculoskeletal: Negative.   Skin: Negative.   Neurological: Negative.   Psychiatric/Behavioral: Negative.     Objective:  Physical Exam Constitutional:      Appearance: She is well-developed.  HENT:     Head: Normocephalic and atraumatic.  Cardiovascular:     Rate and Rhythm: Normal rate and regular rhythm.  Pulmonary:     Effort: Pulmonary effort is normal. No respiratory distress.     Breath sounds: Normal breath sounds. No wheezing or rales.  Abdominal:     General: Bowel sounds are normal. There is no distension.     Palpations: Abdomen is soft.     Tenderness: There is no abdominal tenderness. There is no rebound.  Musculoskeletal:     Cervical back: Normal range of motion.  Skin:    General: Skin is warm and dry.     Comments: Foot exam done  Neurological:     Mental Status: She is alert and oriented to person, place, and time.     Coordination: Coordination normal.     Vitals:   03/09/20 1022  BP: 126/84  Pulse: 98  Temp: 98.8 F (37.1 C)  TempSrc: Oral  SpO2: 97%  Weight: 141 lb (64 kg)  Height: 5' (1.524 m)    This visit occurred during the SARS-CoV-2 public health emergency.  Safety protocols were in place, including screening questions prior to the visit, additional usage of staff PPE, and extensive cleaning of exam room while observing appropriate contact time as indicated for  disinfecting solutions.   Assessment & Plan:

## 2020-03-09 NOTE — Patient Instructions (Signed)
Health Maintenance, Female Adopting a healthy lifestyle and getting preventive care are important in promoting health and wellness. Ask your health care provider about:  The right schedule for you to have regular tests and exams.  Things you can do on your own to prevent diseases and keep yourself healthy. What should I know about diet, weight, and exercise? Eat a healthy diet   Eat a diet that includes plenty of vegetables, fruits, low-fat dairy products, and lean protein.  Do not eat a lot of foods that are high in solid fats, added sugars, or sodium. Maintain a healthy weight Body mass index (BMI) is used to identify weight problems. It estimates body fat based on height and weight. Your health care provider can help determine your BMI and help you achieve or maintain a healthy weight. Get regular exercise Get regular exercise. This is one of the most important things you can do for your health. Most adults should:  Exercise for at least 150 minutes each week. The exercise should increase your heart rate and make you sweat (moderate-intensity exercise).  Do strengthening exercises at least twice a week. This is in addition to the moderate-intensity exercise.  Spend less time sitting. Even light physical activity can be beneficial. Watch cholesterol and blood lipids Have your blood tested for lipids and cholesterol at 78 years of age, then have this test every 5 years. Have your cholesterol levels checked more often if:  Your lipid or cholesterol levels are high.  You are older than 78 years of age.  You are at high risk for heart disease. What should I know about cancer screening? Depending on your health history and family history, you may need to have cancer screening at various ages. This may include screening for:  Breast cancer.  Cervical cancer.  Colorectal cancer.  Skin cancer.  Lung cancer. What should I know about heart disease, diabetes, and high blood  pressure? Blood pressure and heart disease  High blood pressure causes heart disease and increases the risk of stroke. This is more likely to develop in people who have high blood pressure readings, are of African descent, or are overweight.  Have your blood pressure checked: ? Every 3-5 years if you are 18-39 years of age. ? Every year if you are 40 years old or older. Diabetes Have regular diabetes screenings. This checks your fasting blood sugar level. Have the screening done:  Once every three years after age 40 if you are at a normal weight and have a low risk for diabetes.  More often and at a younger age if you are overweight or have a high risk for diabetes. What should I know about preventing infection? Hepatitis B If you have a higher risk for hepatitis B, you should be screened for this virus. Talk with your health care provider to find out if you are at risk for hepatitis B infection. Hepatitis C Testing is recommended for:  Everyone born from 1945 through 1965.  Anyone with known risk factors for hepatitis C. Sexually transmitted infections (STIs)  Get screened for STIs, including gonorrhea and chlamydia, if: ? You are sexually active and are younger than 78 years of age. ? You are older than 78 years of age and your health care provider tells you that you are at risk for this type of infection. ? Your sexual activity has changed since you were last screened, and you are at increased risk for chlamydia or gonorrhea. Ask your health care provider if   you are at risk.  Ask your health care provider about whether you are at high risk for HIV. Your health care provider may recommend a prescription medicine to help prevent HIV infection. If you choose to take medicine to prevent HIV, you should first get tested for HIV. You should then be tested every 3 months for as long as you are taking the medicine. Pregnancy  If you are about to stop having your period (premenopausal) and  you may become pregnant, seek counseling before you get pregnant.  Take 400 to 800 micrograms (mcg) of folic acid every day if you become pregnant.  Ask for birth control (contraception) if you want to prevent pregnancy. Osteoporosis and menopause Osteoporosis is a disease in which the bones lose minerals and strength with aging. This can result in bone fractures. If you are 65 years old or older, or if you are at risk for osteoporosis and fractures, ask your health care provider if you should:  Be screened for bone loss.  Take a calcium or vitamin D supplement to lower your risk of fractures.  Be given hormone replacement therapy (HRT) to treat symptoms of menopause. Follow these instructions at home: Lifestyle  Do not use any products that contain nicotine or tobacco, such as cigarettes, e-cigarettes, and chewing tobacco. If you need help quitting, ask your health care provider.  Do not use street drugs.  Do not share needles.  Ask your health care provider for help if you need support or information about quitting drugs. Alcohol use  Do not drink alcohol if: ? Your health care provider tells you not to drink. ? You are pregnant, may be pregnant, or are planning to become pregnant.  If you drink alcohol: ? Limit how much you use to 0-1 drink a day. ? Limit intake if you are breastfeeding.  Be aware of how much alcohol is in your drink. In the [place] one drink equals one 12 oz bottle of beer (355 mL), one 5 oz glass of wine (148 mL), or one 1 oz glass of hard liquor (44 mL). General instructions  Schedule regular health, dental, and eye exams.  Stay current with your vaccines.  Tell your health care provider if: ? You often feel depressed. ? You have ever been abused or do not feel safe at home. Summary  Adopting a healthy lifestyle and getting preventive care are important in promoting health and wellness.  Follow your health care provider's instructions about healthy  diet, exercising, and getting tested or screened for diseases.  Follow your health care provider's instructions on monitoring your cholesterol and blood pressure. This information is not intended to replace advice given to you by your health care provider. Make sure you discuss any questions you have with your health care provider. Document Revised: 08/14/2018 Document Reviewed: 08/14/2018 Elsevier Patient Education  2020 Elsevier Inc[employer]  

## 2020-03-09 NOTE — Assessment & Plan Note (Signed)
Flu shot yearly. Covid-19 got only 1 dose phizer and advised to get second dosing although this is late. Pneumonia complete. Shingrix counseled. Tetanus up to date. Colonoscopy up to date. Mammogram up to date, pap smear aged out and dexa up to date. Counseled about sun safety and mole surveillance. Counseled about the dangers of distracted driving. Given 10 year screening recommendations.

## 2020-03-09 NOTE — Assessment & Plan Note (Signed)
Taking hctz, metoprolol, losartan and BP at goal. Checking CMP and adjust as needed.

## 2020-03-09 NOTE — Assessment & Plan Note (Signed)
Foot exam done, diet controlled. On ARB and statin. Adjust as needed. Reminded about need for yearly eye exam. Checking lipid panel and HgA1c.

## 2020-04-19 ENCOUNTER — Telehealth: Payer: Self-pay | Admitting: Internal Medicine

## 2020-04-19 NOTE — Progress Notes (Signed)
  Chronic Care Management   Outreach Note  04/19/2020 Name: Mary [name] MRN: [id number] DOB: [birth year]  Referred by: Hoyt Koch, MD Reason for referral : No chief complaint on file.   An unsuccessful telephone outreach was attempted today. The patient was referred to the pharmacist for assistance with care management and care coordination.   Follow Up Plan:   Earney Hamburg Upstream Scheduler

## 2020-05-05 ENCOUNTER — Telehealth: Payer: Self-pay | Admitting: Internal Medicine

## 2020-05-05 NOTE — Progress Notes (Signed)
°  Chronic Care Management   Outreach Note  05/05/2020 Name: Mary [name] MRN: [id number] DOB: 1940/11/25  Referred by: Hoyt Koch, MD Reason for referral : No chief complaint on file.   An unsuccessful telephone outreach was attempted today. The patient was referred to the pharmacist for assistance with care management and care coordination.   Follow Up Plan:   Earney Hamburg Upstream Scheduler

## 2020-05-13 ENCOUNTER — Telehealth: Payer: Self-pay | Admitting: Internal Medicine

## 2020-05-13 NOTE — Progress Notes (Signed)
  Chronic Care Management   Outreach Note  05/13/2020 Name: Mary [name] MRN: [id number] DOB: [birth year]  Referred by: Hoyt Koch, MD Reason for referral : No chief complaint on file.   Third unsuccessful telephone outreach was attempted today. The patient was referred to the pharmacist for assistance with care management and care coordination.   Follow Up Plan:   Carley Perdue [employer] Scheduler

## 2020-07-01 ENCOUNTER — Other Ambulatory Visit: Payer: Self-pay

## 2020-07-01 ENCOUNTER — Ambulatory Visit (INDEPENDENT_AMBULATORY_CARE_PROVIDER_SITE_OTHER): Payer: PPO

## 2020-07-01 VITALS — BP 140/80 | HR 72 | Temp 98.2°F | Resp 16 | Ht 60.0 in | Wt 139.2 lb

## 2020-07-01 DIAGNOSIS — Z0000 Encounter for general adult medical examination without abnormal findings: Secondary | ICD-10-CM | POA: Diagnosis not present

## 2020-07-01 NOTE — Progress Notes (Addendum)
Subjective:   Mary [name] is a 79 y.o. female who presents for Medicare Annual (Subsequent) preventive examination.  Review of Systems    NO ROS. Medicare Wellness Visit. Cardiac Risk Factors include: advanced age (>35men, >21 women);dyslipidemia;family history of premature cardiovascular disease;hypertension     Objective:    Today's Vitals   07/01/20 1108  BP: 140/80  Pulse: 72  Resp: 16  Temp: 98.2 F (36.8 C)  SpO2: 97%  Weight: 139 lb 3.2 oz (63.1 kg)  Height: 5' (1.524 m)  PainSc: 0-No pain   Body mass index is 27.19 kg/m.  Advanced Directives 07/01/2020 06/30/2019 06/27/2018 10/30/2012 10/25/2012 07/13/2012  Does Patient Have a Medical Advance Directive? Yes Yes Yes Patient has advance directive, copy not in chart Patient has advance directive, copy not in chart Patient has advance directive, copy not in chart  Type of Advance Directive Living will [place];Living will [place];Living will [place];Living will [place];Living will Living will  Does patient want to make changes to medical advance directive? No - Patient declined - - - - -  Copy of [place] in Chart? - No - copy requested No - copy requested (No Data) - Copy requested from family  Pre-existing out of facility DNR order (yellow form or pink MOST form) - - - No - No    Current Medications (verified) Outpatient Encounter Medications as of 07/01/2020  Medication Sig   acyclovir (ZOVIRAX) 400 MG tablet TAKE 1 TABLET THREE TIMES A DAY FOR 5 DAYS AS DIRECTED FOR FEVER BLISTERS   aspirin 81 MG tablet Take 81 mg by mouth daily.     Calcium Carbonate-Vit D-Min (CALTRATE 600+D PLUS) 600-400 MG-UNIT per tablet Take 1 tablet by mouth daily. Reported on 03/14/2016   cholecalciferol (VITAMIN D) 1000 UNITS tablet Take 1,000 Units by mouth daily. Reported on 03/14/2016   donepezil (ARICEPT) 10 MG tablet Take 1 tablet  (10 mg total) by mouth at bedtime.   hydrochlorothiazide (HYDRODIURIL) 25 MG tablet Take 1 tablet (25 mg total) by mouth daily.   ipratropium (ATROVENT) 0.03 % nasal spray Place 2 sprays into both nostrils 2 (two) times daily. Do not use for more than 5days.   losartan (COZAAR) 100 MG tablet Take 1 tablet (100 mg total) by mouth daily.   metoprolol succinate (TOPROL-XL) 50 MG 24 hr tablet Take 1 tablet (50 mg total) by mouth daily.   Multiple Vitamins-Minerals (WOMENS MULTIVITAMIN PLUS) TABS Take 1 tablet by mouth daily.     simvastatin (ZOCOR) 20 MG tablet Take 1 tablet (20 mg total) by mouth daily at 6 PM.   vitamin C (ASCORBIC ACID) 500 MG tablet Take 500 mg by mouth daily. Reported on 03/14/2016   No facility-administered encounter medications on file as of 07/01/2020.    Allergies (verified) Oxycodone and Sulfonamide derivatives   History: Past Medical History:  Diagnosis Date   Anxiety    Carpal tunnel syndrome    bilateral   DDD (degenerative disc disease)    Diverticulosis of colon    DJD (degenerative joint disease)    Eyelid cyst    removed from R eyelid 11/20/12 - opth    GERD (gastroesophageal reflux disease)    History of cyst of breast    Hypercholesteremia    Hypertension    Kidney stones    Osteoporosis    Venous insufficiency    Past Surgical History:  Procedure Laterality Date  CARPAL TUNNEL RELEASE Right 2009   CARPAL TUNNEL RELEASE Left 10/30/2012   Procedure: CARPAL TUNNEL RELEASE;  Surgeon: Magnus Sinning, MD;  Location: [facility];  Service: Orthopedics;  Laterality: Left;   CYSTOSCOPY W/ URETERAL STENT PLACEMENT  07/14/2012   Procedure: CYSTOSCOPY WITH RETROGRADE PYELOGRAM/URETERAL STENT PLACEMENT;  Surgeon: Ailene Rud, MD;  Location: [facility];  Service: Urology;  Laterality: Right;   FINGER ARTHROPLASTY Left 10/30/2012   Procedure: thumb ARTHROPLASTY;  Surgeon: Magnus Sinning, MD;  Location: [facility];   Service: Orthopedics;  Laterality: Left;  carpometacarpal arthroplasty thumb   HOLMIUM LASER APPLICATION  73/71/0626   Procedure: HOLMIUM LASER APPLICATION;  Surgeon: Ailene Rud, MD;  Location: [facility];  Service: Urology;  Laterality: Right;   Holmium laser lithotripsy for right ureteral stone  6/12   by DrGrapey   LAMINECTOMY AND MICRODISCECTOMY LUMBAR SPINE  02/2002   Dr. Collier Salina   Retropubic suburethral sling for urinary incontinence  6/12    by DrGrapey   right rotator cuff surgery  04/2005   Dr. Collier Salina   Family History  Problem Relation Age of Onset   Heart failure Mother 76   Liver cancer Brother 81   Social History   Socioeconomic History   Marital status: Widowed    Spouse name: Not on file   Number of children: 1   Years of education: Not on file   Highest education level: Not on file  Occupational History   Occupation: retired  Tobacco Use   Smoking status: Never Smoker   Smokeless tobacco: Never Used  Scientific laboratory technician Use: Never used  Substance and Sexual Activity   Alcohol use: No   Drug use: No   Sexual activity: Never  Other Topics Concern   Not on file  Social History Narrative   Widowed since 2006   Single, lives alone   Close with sister Enid Derry Redden and her g-kids   Social Determinants of Health   Financial Resource Strain: Low Risk    Difficulty of Paying Living Expenses: Not hard at all  Food Insecurity: No Food Insecurity   Worried About Charity fundraiser in the Last Year: Never true   Arboriculturist in the Last Year: Never true  Transportation Needs: No Transportation Needs   Lack of Transportation (Medical): No   Lack of Transportation (Non-Medical): No  Physical Activity: Sufficiently Active   Days of Exercise per Week: 5 days   Minutes of Exercise per Session: 30 min  Stress: No Stress Concern Present   Feeling of Stress : Not at all  Social Connections: Moderately Integrated   Frequency of Communication with  Friends and Family: More than three times a week   Frequency of Social Gatherings with Friends and Family: Once a week   Attends Religious Services: More than 4 times per year   Active Member of [employer] or Organizations: Yes   Attends Archivist Meetings: More than 4 times per year   Marital Status: Widowed    Tobacco Counseling Counseling given: Not Answered   Clinical Intake:  Pre-visit preparation completed: Yes  Pain : No/denies pain Pain Score: 0-No pain     BMI - recorded: 27.19 Nutritional Status: BMI 25 -29 Overweight Nutritional Risks: None Diabetes: No CBG done?: No Did pt. bring in CBG monitor from home?: No  How often do you need to have someone help you when you read instructions, pamphlets, or other  written materials from your doctor or pharmacy?: 1 - Never What is the last grade level you completed in school?: 10th grade  Diabetic? no  Interpreter Needed?: No  Information entered by :: Lisette Abu, LPN   Activities of Daily Living In your present state of health, do you have any difficulty performing the following activities: 07/01/2020  Hearing? N  Vision? N  Difficulty concentrating or making decisions? N  Walking or climbing stairs? N  Dressing or bathing? N  Doing errands, shopping? N  Preparing Food and eating ? N  Using the Toilet? N  In the past six months, have you accidently leaked urine? N  Do you have problems with loss of bowel control? N  Managing your Medications? N  Managing your Finances? N  Housekeeping or managing your Housekeeping? N  Some recent data might be hidden    Patient Care Team: Hoyt Koch, MD as PCP - General (Internal Medicine) Rana Snare, MD (Urology) Bobbye Charleston, MD (Obstetrics and Gynecology) Ladene Artist, MD (Gastroenterology) Monna Fam, MD (Ophthalmology)  Indicate any recent Medical Services you may have received from other than [facility] providers in the past year  (date may be approximate).     Assessment:   This is a routine wellness examination for [facility].  Hearing/Vision screen No exam data present  Dietary issues and exercise activities discussed: Current Exercise Habits: Home exercise routine, Type of exercise: walking;Other - see comments (yard work, gardening), Time (Minutes): 30, Frequency (Times/Week): 5, Weekly Exercise (Minutes/Week): 150, Intensity: Moderate, Exercise limited by: None identified  Goals      Patient Stated     Continue to be active physically and in church. Love family and enjoy life.       Depression Screen PHQ 2/9 Scores 07/01/2020 06/30/2019 06/27/2018 06/19/2017 09/15/2015 06/04/2014 04/01/2013  PHQ - 2 Score 0 0 0 0 0 0 0    Fall Risk Fall Risk  07/01/2020 06/30/2019 06/27/2018 06/19/2017 09/15/2015  Falls in the past year? 0 0 No No No  Number falls in past yr: 0 0 - - -  Injury with Fall? 0 0 - - -  Risk for fall due to : No Fall Risks - - - -  Follow up Falls evaluation completed;Education provided - - - -    Any stairs in or around the home? No  If so, are there any without handrails? No  Home free of loose throw rugs in walkways, pet beds, electrical cords, etc? Yes  Adequate lighting in your home to reduce risk of falls? Yes   ASSISTIVE DEVICES UTILIZED TO PREVENT FALLS:  Life alert? No  Use of a cane, walker or w/c? No  Grab bars in the bathroom? No  Shower chair or bench in shower? No  Elevated toilet seat or a handicapped toilet? No   TIMED UP AND GO:  Was the test performed? No .  Length of time to ambulate 10 feet: 0 sec.   Gait steady and fast without use of assistive device  Cognitive Function: MMSE - Mini Mental State Exam 06/27/2018  Orientation to time 3  Orientation to Place 5  Registration 3  Attention/ Calculation 5  Recall 1  Language- name 2 objects 2  Language- repeat 1  Language- follow 3 step command 3  Language- read & follow direction 1  Write a sentence 1    Copy design 1  Total score 26     6CIT Screen 07/01/2020 06/30/2019  What Year? 0  points 0 points  What month? 0 points 0 points  What time? 0 points 0 points  Count back from 20 0 points 0 points  Months in reverse 0 points 0 points  Repeat phrase 0 points 0 points  Total Score 0 0    Immunizations Immunization History  Administered Date(s) Administered   Fluad Quad(high Dose 65+) 07/11/2019   Influenza Split 06/30/2011, 06/07/2012   Influenza Whole 06/07/2010   Influenza, High Dose Seasonal PF 06/04/2014, 06/09/2016, 06/19/2017, 06/27/2018   Influenza,inj,Quad PF,6+ Mos 06/01/2015   Influenza-Unspecified 07/05/2013   PFIZER SARS-COV-2 Vaccination 11/08/2019   Pneumococcal Conjugate-13 09/15/2015   Pneumococcal Polysaccharide-23 09/22/2008   Td 09/22/2008   Tdap 03/06/2019   Zoster 08/18/2013   Zoster Recombinat (Shingrix) 06/22/2017, 02/01/2018    TDAP status: Up to date Flu Vaccine status: Up to date Pneumococcal vaccine status: Up to date Covid-19 vaccine status: Completed vaccines  Qualifies for Shingles Vaccine? Yes   Zostavax completed Yes   Shingrix Completed?: Yes  Screening Tests Health Maintenance  Topic Date Due   Hepatitis C Screening  Never done   OPHTHALMOLOGY EXAM  07/03/2019   HEMOGLOBIN A1C  09/06/2019   COVID-19 Vaccine (2 - Pfizer 2-dose series) 11/29/2019   INFLUENZA VACCINE  04/04/2020   FOOT EXAM  03/09/2021   TETANUS/TDAP  03/05/2029   DEXA SCAN  Completed   PNA vac Low Risk Adult  Completed    Health Maintenance  Health Maintenance Due  Topic Date Due   Hepatitis C Screening  Never done   OPHTHALMOLOGY EXAM  07/03/2019   HEMOGLOBIN A1C  09/06/2019   COVID-19 Vaccine (2 - Pfizer 2-dose series) 11/29/2019   INFLUENZA VACCINE  04/04/2020    Colorectal cancer screening: No longer required.  Mammogram status: No longer required.  Bone Density status: Completed 10/07/2012. Results reflect: Bone density results: OSTEOPENIA. Repeat  every 2 years.  Lung Cancer Screening: (Low Dose CT Chest recommended if Age 11-80 years, 30 pack-year currently smoking OR have quit w/in 15years.) does not qualify.   Lung Cancer Screening Referral: no  Additional Screening:  Hepatitis C Screening: does qualify; Completed no  Vision Screening: Recommended annual ophthalmology exams for early detection of glaucoma and other disorders of the eye. Is the patient up to date with their annual eye exam?  Yes  Who is the provider or what is the name of the office in which the patient attends annual eye exams? [facility] If pt is not established with a provider, would they like to be referred to a provider to establish care? No .   Dental Screening: Recommended annual dental exams for proper oral hygiene  Community Resource Referral / Chronic Care Management: CRR required this visit?  No   CCM required this visit?  No      Plan:     I have personally reviewed and noted the following in the patient's chart:   Medical and social history Use of alcohol, tobacco or illicit drugs  Current medications and supplements Functional ability and status Nutritional status Physical activity Advanced directives List of other physicians Hospitalizations, surgeries, and ER visits in previous 12 months Vitals Screenings to include cognitive, depression, and falls Referrals and appointments  In addition, I have reviewed and discussed with patient certain preventive protocols, quality metrics, and best practice recommendations. A written personalized care plan for preventive services as well as general preventive health recommendations were provided to patient.     Sheral Flow, LPN   [id number]  Nurse Notes: n/a   Medical screening examination/treatment/procedure(s) were performed by non-physician practitioner and as supervising physician I was immediately available for consultation/collaboration.  I agree with above. Lew Dawes, MD

## 2020-07-01 NOTE — Patient Instructions (Signed)
Mary [name] , Thank you for taking time to come for your Medicare Wellness Visit. I appreciate your ongoing commitment to your health goals. Please review the following plan we discussed and let me know if I can assist you in the future.   Screening recommendations/referrals: Colonoscopy: 11/11/2009; no repeat due to age Mammogram: 06/04/2013 Bone Density: 10/07/2012 Recommended yearly ophthalmology/optometry visit for glaucoma screening and checkup Recommended yearly dental visit for hygiene and checkup  Vaccinations: Influenza vaccine: 07/11/2019; will check with local pharmacy  Pneumococcal vaccine: up to date Tdap vaccine: 03/06/2019 Shingles vaccine: up to date   Covid-19: 11/08/2019; need second dose   Advanced directives: Please bring a copy of your health care power of attorney and living will to the office at your convenience.  Conditions/risks identified: Yes; Reviewed health maintenance screenings with patient today and relevant education, vaccines, and/or referrals were provided. Please continue to do your personal lifestyle choices by: daily care of teeth and gums, regular physical activity (goal should be 5 days a week for 30 minutes), eat a healthy diet, avoid tobacco and drug use, limiting any alcohol intake, taking a low-dose aspirin (if not allergic or have been advised by your provider otherwise) and taking vitamins and minerals as recommended by your provider. Continue doing brain stimulating activities (puzzles, reading, adult coloring books, staying active) to keep memory sharp. Continue to eat heart healthy diet (full of fruits, vegetables, whole grains, lean protein, water--limit salt, fat, and sugar intake) and increase physical activity as tolerated.  Next appointment: Please schedule your next Medicare Wellness Visit with your Nurse Health Advisor in 1 year by calling [phone].   Preventive Care 73 Years and Older, Female Preventive care refers to lifestyle choices and  visits with your health care provider that can promote health and wellness. What does preventive care include?  A yearly physical exam. This is also called an annual well check.  Dental exams once or twice a year.  Routine eye exams. Ask your health care provider how often you should have your eyes checked.  Personal lifestyle choices, including:  Daily care of your teeth and gums.  Regular physical activity.  Eating a healthy diet.  Avoiding tobacco and drug use.  Limiting alcohol use.  Practicing safe sex.  Taking low-dose aspirin every day.  Taking vitamin and mineral supplements as recommended by your health care provider. What happens during an annual well check? The services and screenings done by your health care provider during your annual well check will depend on your age, overall health, lifestyle risk factors, and family history of disease. Counseling  Your health care provider may ask you questions about your:  Alcohol use.  Tobacco use.  Drug use.  Emotional well-being.  Home and relationship well-being.  Sexual activity.  Eating habits.  History of falls.  Memory and ability to understand (cognition).  Work and work Statistician.  Reproductive health. Screening  You may have the following tests or measurements:  Height, weight, and BMI.  Blood pressure.  Lipid and cholesterol levels. These may be checked every 5 years, or more frequently if you are over 45 years old.  Skin check.  Lung cancer screening. You may have this screening every year starting at age 18 if you have a 30-pack-year history of smoking and currently smoke or have quit within the past 15 years.  Fecal occult blood test (FOBT) of the stool. You may have this test every year starting at age 55.  Flexible sigmoidoscopy or colonoscopy. You  may have a sigmoidoscopy every 5 years or a colonoscopy every 10 years starting at age 44.  Hepatitis C blood test.  Hepatitis B  blood test.  Sexually transmitted disease (STD) testing.  Diabetes screening. This is done by checking your blood sugar (glucose) after you have not eaten for a while (fasting). You may have this done every 1-3 years.  Bone density scan. This is done to screen for osteoporosis. You may have this done starting at age 44.  Mammogram. This may be done every 1-2 years. Talk to your health care provider about how often you should have regular mammograms. Talk with your health care provider about your test results, treatment options, and if necessary, the need for more tests. Vaccines  Your health care provider may recommend certain vaccines, such as:  Influenza vaccine. This is recommended every year.  Tetanus, diphtheria, and acellular pertussis (Tdap, Td) vaccine. You may need a Td booster every 10 years.  Zoster vaccine. You may need this after age 61.  Pneumococcal 13-valent conjugate (PCV13) vaccine. One dose is recommended after age 50.  Pneumococcal polysaccharide (PPSV23) vaccine. One dose is recommended after age 2. Talk to your health care provider about which screenings and vaccines you need and how often you need them. This information is not intended to replace advice given to you by your health care provider. Make sure you discuss any questions you have with your health care provider. Document Released: 09/17/2015 Document Revised: 05/10/2016 Document Reviewed: 06/22/2015 Elsevier Interactive Patient Education  2017 [place] Prevention in the Home Falls can cause injuries. They can happen to people of all ages. There are many things you can do to make your home safe and to help prevent falls. What can I do on the outside of my home?  Regularly fix the edges of walkways and driveways and fix any cracks.  Remove anything that might make you trip as you walk through a door, such as a raised step or threshold.  Trim any bushes or trees on the path to your  home.  Use bright outdoor lighting.  Clear any walking paths of anything that might make someone trip, such as rocks or tools.  Regularly check to see if handrails are loose or broken. Make sure that both sides of any steps have handrails.  Any raised decks and porches should have guardrails on the edges.  Have any leaves, snow, or ice cleared regularly.  Use sand or salt on walking paths during winter.  Clean up any spills in your garage right away. This includes oil or grease spills. What can I do in the bathroom?  Use night lights.  Install grab bars by the toilet and in the tub and shower. Do not use towel bars as grab bars.  Use non-skid mats or decals in the tub or shower.  If you need to sit down in the shower, use a plastic, non-slip stool.  Keep the floor dry. Clean up any water that spills on the floor as soon as it happens.  Remove soap buildup in the tub or shower regularly.  Attach bath mats securely with double-sided non-slip rug tape.  Do not have throw rugs and other things on the floor that can make you trip. What can I do in the bedroom?  Use night lights.  Make sure that you have a light by your bed that is easy to reach.  Do not use any sheets or blankets that are too big for  your bed. They should not hang down onto the floor.  Have a firm chair that has side arms. You can use this for support while you get dressed.  Do not have throw rugs and other things on the floor that can make you trip. What can I do in the kitchen?  Clean up any spills right away.  Avoid walking on wet floors.  Keep items that you use a lot in easy-to-reach places.  If you need to reach something above you, use a strong step stool that has a grab bar.  Keep electrical cords out of the way.  Do not use floor polish or wax that makes floors slippery. If you must use wax, use non-skid floor wax.  Do not have throw rugs and other things on the floor that can make you  trip. What can I do with my stairs?  Do not leave any items on the stairs.  Make sure that there are handrails on both sides of the stairs and use them. Fix handrails that are broken or loose. Make sure that handrails are as long as the stairways.  Check any carpeting to make sure that it is firmly attached to the stairs. Fix any carpet that is loose or worn.  Avoid having throw rugs at the top or bottom of the stairs. If you do have throw rugs, attach them to the floor with carpet tape.  Make sure that you have a light switch at the top of the stairs and the bottom of the stairs. If you do not have them, ask someone to add them for you. What else can I do to help prevent falls?  Wear shoes that:  Do not have high heels.  Have rubber bottoms.  Are comfortable and fit you well.  Are closed at the toe. Do not wear sandals.  If you use a stepladder:  Make sure that it is fully opened. Do not climb a closed stepladder.  Make sure that both sides of the stepladder are locked into place.  Ask someone to hold it for you, if possible.  Clearly mark and make sure that you can see:  Any grab bars or handrails.  First and last steps.  Where the edge of each step is.  Use tools that help you move around (mobility aids) if they are needed. These include:  Canes.  Walkers.  Scooters.  Crutches.  Turn on the lights when you go into a dark area. Replace any light bulbs as soon as they burn out.  Set up your furniture so you have a clear path. Avoid moving your furniture around.  If any of your floors are uneven, fix them.  If there are any pets around you, be aware of where they are.  Review your medicines with your doctor. Some medicines can make you feel dizzy. This can increase your chance of falling. Ask your doctor what other things that you can do to help prevent falls. This information is not intended to replace advice given to you by your health care provider. Make  sure you discuss any questions you have with your health care provider. Document Released: 06/17/2009 Document Revised: 01/27/2016 Document Reviewed: 09/25/2014 Elsevier Interactive Patient Education  2017 [employer].

## 2021-01-10 ENCOUNTER — Telehealth: Payer: Self-pay

## 2021-01-10 NOTE — Chronic Care Management (AMB) (Signed)
Attempted to call patient to schedule initial CCM appointment, no answer at home or mobile phone, could not leave message on either phone   Mary [name], PharmD Clinical Pharmacist, [place]

## 2021-01-27 ENCOUNTER — Telehealth: Payer: Self-pay | Admitting: Internal Medicine

## 2021-01-27 NOTE — Progress Notes (Signed)
  Chronic Care Management   Outreach Note  01/27/2021 Name: Kylah Maresh MRN: [id number] DOB: 1941-03-14  Referred by: Hoyt Koch, MD Reason for referral : No chief complaint on file.   An unsuccessful telephone outreach was attempted today. The patient was referred to the pharmacist for assistance with care management and care coordination.   Follow Up Plan:   Lauretta Grill Upstream Scheduler

## 2021-02-07 ENCOUNTER — Telehealth: Payer: Self-pay | Admitting: Internal Medicine

## 2021-02-07 NOTE — Progress Notes (Signed)
  Chronic Care Management   Outreach Note  02/07/2021 Name: Mary [name] MRN: [id number] DOB: [birth year]  Referred by: Hoyt Koch, MD Reason for referral : No chief complaint on file.   A second unsuccessful telephone outreach was attempted today. The patient was referred to pharmacist for assistance with care management and care coordination.  Follow Up Plan:   Lauretta Grill Upstream Scheduler

## 2021-03-02 ENCOUNTER — Telehealth: Payer: Self-pay | Admitting: Internal Medicine

## 2021-03-02 NOTE — Chronic Care Management (AMB) (Signed)
  Chronic Care Management   Outreach Note  03/02/2021 Name: Rainn Zupko MRN: [id number] DOB: 01/25/1941  Referred by: Hoyt Koch, MD Reason for referral : No chief complaint on file.   Third unsuccessful telephone outreach was attempted today. The patient was referred to the pharmacist for assistance with care management and care coordination.   Follow Up Plan:   Lauretta Grill Upstream Scheduler

## 2021-03-10 ENCOUNTER — Telehealth: Payer: Self-pay | Admitting: Internal Medicine

## 2021-03-10 ENCOUNTER — Encounter: Payer: PPO | Admitting: Internal Medicine

## 2021-03-10 NOTE — Progress Notes (Signed)
  Chronic Care Management   Outreach Note  03/10/2021 Name: Mary [name] MRN: [id number] DOB: 03-13-1941  Referred by: Hoyt Koch, MD Reason for referral : No chief complaint on file.   Third unsuccessful telephone outreach was attempted today. The patient was referred to the pharmacist for assistance with care management and care coordination.   Follow Up Plan:   Lauretta Grill Upstream Scheduler

## 2021-03-11 ENCOUNTER — Other Ambulatory Visit: Payer: Self-pay | Admitting: Internal Medicine

## 2021-03-17 ENCOUNTER — Other Ambulatory Visit: Payer: Self-pay | Admitting: Internal Medicine

## 2021-03-26 ENCOUNTER — Other Ambulatory Visit: Payer: Self-pay | Admitting: Internal Medicine

## 2021-03-27 ENCOUNTER — Other Ambulatory Visit: Payer: Self-pay | Admitting: Internal Medicine

## 2021-04-13 ENCOUNTER — Other Ambulatory Visit: Payer: Self-pay | Admitting: Internal Medicine

## 2021-05-02 ENCOUNTER — Other Ambulatory Visit: Payer: Self-pay | Admitting: Internal Medicine

## 2021-06-09 ENCOUNTER — Other Ambulatory Visit: Payer: Self-pay | Admitting: Internal Medicine

## 2022-01-03 ENCOUNTER — Ambulatory Visit (INDEPENDENT_AMBULATORY_CARE_PROVIDER_SITE_OTHER): Payer: PPO | Admitting: Internal Medicine

## 2022-01-03 ENCOUNTER — Encounter: Payer: Self-pay | Admitting: Internal Medicine

## 2022-01-03 VITALS — BP 122/90 | HR 102 | Resp 18 | Ht 60.0 in | Wt 137.2 lb

## 2022-01-03 DIAGNOSIS — F0280 Dementia in other diseases classified elsewhere without behavioral disturbance: Secondary | ICD-10-CM | POA: Diagnosis not present

## 2022-01-03 DIAGNOSIS — E042 Nontoxic multinodular goiter: Secondary | ICD-10-CM

## 2022-01-03 DIAGNOSIS — Z1159 Encounter for screening for other viral diseases: Secondary | ICD-10-CM | POA: Diagnosis not present

## 2022-01-03 DIAGNOSIS — E119 Type 2 diabetes mellitus without complications: Secondary | ICD-10-CM

## 2022-01-03 DIAGNOSIS — Z0000 Encounter for general adult medical examination without abnormal findings: Secondary | ICD-10-CM | POA: Diagnosis not present

## 2022-01-03 DIAGNOSIS — G309 Alzheimer's disease, unspecified: Secondary | ICD-10-CM | POA: Diagnosis not present

## 2022-01-03 DIAGNOSIS — I10 Essential (primary) hypertension: Secondary | ICD-10-CM

## 2022-01-03 DIAGNOSIS — E785 Hyperlipidemia, unspecified: Secondary | ICD-10-CM | POA: Diagnosis not present

## 2022-01-03 DIAGNOSIS — E1169 Type 2 diabetes mellitus with other specified complication: Secondary | ICD-10-CM

## 2022-01-03 LAB — COMPREHENSIVE METABOLIC PANEL
ALT: 17 U/L (ref 0–35)
AST: 22 U/L (ref 0–37)
Albumin: 4.3 g/dL (ref 3.5–5.2)
Alkaline Phosphatase: 90 U/L (ref 39–117)
BUN: 13 mg/dL (ref 6–23)
CO2: 28 mEq/L (ref 19–32)
Calcium: 9.8 mg/dL (ref 8.4–10.5)
Chloride: 102 mEq/L (ref 96–112)
Creatinine, Ser: 1.25 mg/dL — ABNORMAL HIGH (ref 0.40–1.20)
GFR: 40.98 mL/min — ABNORMAL LOW (ref >60.00)
Glucose, Bld: 144 mg/dL — ABNORMAL HIGH (ref 70–99)
Potassium: 3.9 mEq/L (ref 3.5–5.1)
Sodium: 139 mEq/L (ref 135–145)
Total Bilirubin: 0.6 mg/dL (ref 0.2–1.2)
Total Protein: 7.3 g/dL (ref 6.0–8.3)

## 2022-01-03 LAB — LIPID PANEL
Cholesterol: 286 mg/dL — ABNORMAL HIGH (ref 0–200)
HDL: 62.7 mg/dL (ref >39.00)
LDL Cholesterol: 187 mg/dL — ABNORMAL HIGH (ref 0–99)
NonHDL: 222.82
Total CHOL/HDL Ratio: 5
Triglycerides: 177 mg/dL — ABNORMAL HIGH (ref 0.0–149.0)
VLDL: 35.4 mg/dL (ref 0.0–40.0)

## 2022-01-03 LAB — CBC
HCT: 47.2 % — ABNORMAL HIGH (ref 36.0–46.0)
Hemoglobin: 15.6 g/dL — ABNORMAL HIGH (ref 12.0–15.0)
MCHC: 33.1 g/dL (ref 30.0–36.0)
MCV: 92.7 fl (ref 78.0–100.0)
Platelets: 284 10*3/uL (ref 150.0–400.0)
RBC: 5.09 Mil/uL (ref 3.87–5.11)
RDW: 13.2 % (ref 11.5–15.5)
WBC: 9.8 10*3/uL (ref 4.0–10.5)

## 2022-01-03 LAB — MICROALBUMIN / CREATININE URINE RATIO
Creatinine,U: 454.8 mg/dL
Microalb Creat Ratio: 2.4 mg/g (ref 0.0–30.0)
Microalb, Ur: 10.8 mg/dL — ABNORMAL HIGH (ref 0.0–1.9)

## 2022-01-03 LAB — TSH: TSH: 0.7 u[IU]/mL (ref 0.35–5.50)

## 2022-01-03 LAB — HEMOGLOBIN A1C: Hgb A1c MFr Bld: 6.4 % (ref 4.6–6.5)

## 2022-01-03 LAB — T4, FREE: Free T4: 0.91 ng/dL (ref 0.60–1.60)

## 2022-01-03 MED ORDER — LOSARTAN POTASSIUM 50 MG PO TABS: 50.0000 mg | ORAL_TABLET | Freq: Every day | ORAL | 3 refills | Status: AC

## 2022-01-03 MED ORDER — SIMVASTATIN 20 MG PO TABS
20.0000 mg | ORAL_TABLET | Freq: Every day | ORAL | 3 refills | Status: AC
Start: 2022-01-03 — End: ?

## 2022-01-03 NOTE — Assessment & Plan Note (Signed)
Checking TSH and free T4.  ?

## 2022-01-03 NOTE — Assessment & Plan Note (Signed)
Checking lipid panel, not on statin due to running out and no return visit. Refilled simvastatin 20 mg daily and will adjust based on lipid panel for goal LDL<70. ?

## 2022-01-03 NOTE — Assessment & Plan Note (Addendum)
Overall progressive since last visit in 2021. Would rate at least moderate today. Son present with her today and he is managing finances. She is still driving short distances asked him to monitor for safety. She lives alone and is okay for now with that. We discussed advanced directives and likely progression of this in future.  ?

## 2022-01-03 NOTE — Assessment & Plan Note (Signed)
No follow up in some time. Checking HgA1c and microalbumin to creatinine ratio. Is not on any medications. Refilled ARB and statin. Checking lipid panel and CMP and adjust as needed. Unclear control.  ?

## 2022-01-03 NOTE — Assessment & Plan Note (Signed)
BP is not at goal off medications. She was previously taking losartan 100 mg daily, metoprolol 50 mg daily and hctz 25 mg daily. BP 122/90 will resume losartan 50 mg daily (due to concurrent DM). Checking CMP and adjust as needed.  ?

## 2022-01-03 NOTE — Patient Instructions (Signed)
We are checking the labs and will send in the low dose losartan and the simvastatin (cholesterol). You do not need the other medicines. ?

## 2022-01-03 NOTE — Assessment & Plan Note (Signed)
Flu shot yearly. Covid-19 counseled. Pneumonia complete. Shingrix complete. Tetanus due 2030. Colonoscopy aged out. Mammogram aged out, pap smear aged out and dexa complete. Counseled about sun safety and mole surveillance. Counseled about the dangers of distracted driving. Given 10 year screening recommendations.   

## 2022-01-03 NOTE — Progress Notes (Signed)
? ?Subjective:  ? ?Patient ID: Mary [name], female    DOB: [birth year], 81 y.o.   MRN: [id number] ? ?HPI ?Here for medicare wellness and physical, no new complaints, no care since 2021 needs assessment of medical conditions out of meds since mid 2022. Please see A/P for status and treatment of chronic medical problems.  ? ?Diet: DM since diabetic ?Physical activity: sedentary ?Depression/mood screen: negative ?Hearing: intact to whispered voice ?Visual acuity: grossly normal, [facility] performs annual eye exam  ?ADLs: capable ?Fall risk: none ?Home safety: good ?Cognitive evaluation: intact to orientation to person, short term memory poor ?EOL planning: adv directives discussed ? ?[facility] Office Visit from 01/03/2022 in [facility]  ?PHQ-2 Total Score 0  ? ?  ?  ?[facility] [place] Office Visit from 01/03/2022 in [facility]  ?PHQ-9 Total Score 0  ? ?  ? ? ?  06/19/2017  ?  8:55 AM 06/27/2018  ?  9:19 AM 06/30/2019  ?  9:44 AM 07/01/2020  ? 11:40 AM 01/03/2022  ?  9:41 AM  ?Fall Risk  ?Falls in the past year? No No 0 0 0  ?Was there an injury with Fall?   0 0 0  ?Fall Risk Category Calculator   0 0 0  ?Fall Risk Category   Low Low Low  ?Patient Fall Risk Level   Low fall risk Low fall risk Low fall risk  ?Patient at Risk for Falls Due to    No Fall Risks   ?Fall risk Follow up    Falls evaluation completed;Education provided   ? ? ?I have personally reviewed and have noted ?1. The patient's medical and social history - reviewed today no changes ?2. Their use of alcohol, tobacco or illicit drugs ?3. Their current medications and supplements ?4. The patient's functional ability including ADL's, fall risks, home safety risks and hearing or visual impairment. ?5. Diet and physical activities ?6. Evidence for depression or mood disorders ?7. Care team reviewed and updated ?8.  The patient is not on an opioid pain medication. ? ?Patient Care Team: ?Hoyt Koch, MD  as PCP - General (Internal Medicine) ?Rana Snare, MD (Inactive) (Urology) ?Bobbye Charleston, MD (Obstetrics and Gynecology) ?Ladene Artist, MD (Gastroenterology) ?Monna Fam, MD (Ophthalmology) ?Past Medical History:  ?Diagnosis Date  ? Anxiety   ? Carpal tunnel syndrome   ? bilateral  ? DDD (degenerative disc disease)   ? Diverticulosis of colon   ? DJD (degenerative joint disease)   ? Eyelid cyst   ? removed from R eyelid 11/20/12 - opth   ? GERD (gastroesophageal reflux disease)   ? History of cyst of breast   ? Hypercholesteremia   ? Hypertension   ? Kidney stones   ? Osteoporosis   ? Venous insufficiency   ? ?Past Surgical History:  ?Procedure Laterality Date  ? CARPAL TUNNEL RELEASE Right 2009  ? CARPAL TUNNEL RELEASE Left 10/30/2012  ? Procedure: CARPAL TUNNEL RELEASE;  Surgeon: Magnus Sinning, MD;  Location: [facility];  Service: Orthopedics;  Laterality: Left;  ? CYSTOSCOPY W/ URETERAL STENT PLACEMENT  07/14/2012  ? Procedure: CYSTOSCOPY WITH RETROGRADE PYELOGRAM/URETERAL STENT PLACEMENT;  Surgeon: Ailene Rud, MD;  Location: [facility];  Service: Urology;  Laterality: Right;  ? FINGER ARTHROPLASTY Left 10/30/2012  ? Procedure: thumb ARTHROPLASTY;  Surgeon: Magnus Sinning, MD;  Location: [facility];  Service: Orthopedics;  Laterality: Left;  carpometacarpal arthroplasty  thumb  ? HOLMIUM LASER APPLICATION  74/04/1447  ? Procedure: HOLMIUM LASER APPLICATION;  Surgeon: Ailene Rud, MD;  Location: [facility];  Service: Urology;  Laterality: Right;  ? Holmium laser lithotripsy for right ureteral stone  6/12  ? by DrGrapey  ? LAMINECTOMY AND MICRODISCECTOMY LUMBAR SPINE  02/2002  ? Dr. Collier Salina  ? Retropubic suburethral sling for urinary incontinence  6/12   ? by DrGrapey  ? right rotator cuff surgery  04/2005  ? Dr. Collier Salina  ? ?Family History  ?Problem Relation Age of Onset  ? Heart failure Mother 74  ? Liver cancer Brother 93  ? ?Review of Systems   ?Constitutional: Negative.   ?HENT: Negative.    ?Eyes: Negative.   ?Respiratory:  Negative for cough, chest tightness and shortness of breath.   ?Cardiovascular:  Negative for chest pain, palpitations and leg swelling.  ?Gastrointestinal:  Negative for abdominal distention, abdominal pain, constipation, diarrhea, nausea and vomiting.  ?Musculoskeletal: Negative.   ?Skin:  Positive for color change.  ?Neurological: Negative.   ?Psychiatric/Behavioral: Negative.    ? ?Objective:  ?Physical Exam ?Constitutional:   ?   Appearance: She is well-developed.  ?HENT:  ?   Head: Normocephalic and atraumatic.  ?Cardiovascular:  ?   Rate and Rhythm: Normal rate and regular rhythm.  ?Pulmonary:  ?   Effort: Pulmonary effort is normal. No respiratory distress.  ?   Breath sounds: Normal breath sounds. No wheezing or rales.  ?Abdominal:  ?   General: Bowel sounds are normal. There is no distension.  ?   Palpations: Abdomen is soft.  ?   Tenderness: There is no abdominal tenderness. There is no rebound.  ?Musculoskeletal:  ?   Cervical back: Normal range of motion.  ?Skin: ?   General: Skin is warm and dry.  ?   Comments: Foot exam done  ?Neurological:  ?   Mental Status: She is alert.  ?   Coordination: Coordination normal.  ? ? ?Vitals:  ? 01/03/22 0937  ?BP: 122/90  ?Pulse: (!) 102  ?Resp: 18  ?SpO2: 97%  ?Weight: 137 lb 3.2 oz (62.2 kg)  ?Height: 5' (1.524 m)  ? ? ?This visit occurred during the SARS-CoV-2 public health emergency.  Safety protocols were in place, including screening questions prior to the visit, additional usage of staff PPE, and extensive cleaning of exam room while observing appropriate contact time as indicated for disinfecting solutions.  ? ?Assessment & Plan:  ? ?

## 2022-01-04 LAB — HEPATITIS C ANTIBODY
Hepatitis C Ab: NONREACTIVE
SIGNAL TO CUT-OFF: 0.07 (ref <1.00)

## 2022-02-24 ENCOUNTER — Other Ambulatory Visit: Payer: Self-pay | Admitting: Internal Medicine

## 2022-12-21 ENCOUNTER — Telehealth: Payer: Self-pay

## 2022-12-21 NOTE — Telephone Encounter (Signed)
Called patient to schedule Medicare Annual Wellness Visit (AWV). Unable to reach patient.  Last date of AWV: 01/04/23  Please schedule an appointment at any time on Annual Wellness Schedule.

## 2023-03-29 ENCOUNTER — Telehealth: Payer: Self-pay | Admitting: *Deleted

## 2023-03-29 NOTE — Telephone Encounter (Signed)
03/29/2023 Name: Mary [name] MRN: [id number] DOB: 05-Dec-1940  Referred by: Myrlene Broker, MD Reason for referral : No chief complaint on file.   Attempted to call and schedule follow up NA NVM
